# Patient Record
Sex: Male | Born: 1970 | Race: White | Hispanic: No | Marital: Married | Smoking: Never smoker
Health system: Southern US, Community
[De-identification: ages and names within clinical notes are randomized; demographics above are authoritative.]

## PROBLEM LIST (undated history)

## (undated) DIAGNOSIS — K219 Gastro-esophageal reflux disease without esophagitis: Secondary | ICD-10-CM

## (undated) DIAGNOSIS — B349 Viral infection, unspecified: Secondary | ICD-10-CM

## (undated) DIAGNOSIS — E785 Hyperlipidemia, unspecified: Secondary | ICD-10-CM

## (undated) DIAGNOSIS — I10 Essential (primary) hypertension: Secondary | ICD-10-CM

## (undated) HISTORY — DX: Viral infection, unspecified: B34.9

## (undated) HISTORY — DX: Essential (primary) hypertension: I10

## (undated) HISTORY — DX: Gastro-esophageal reflux disease without esophagitis: K21.9

## (undated) HISTORY — DX: Hyperlipidemia, unspecified: E78.5

---

## 2004-09-12 ENCOUNTER — Ambulatory Visit: Payer: Self-pay | Admitting: Gastroenterology

## 2006-07-12 ENCOUNTER — Ambulatory Visit: Payer: Self-pay | Admitting: Family Medicine

## 2006-09-17 ENCOUNTER — Other Ambulatory Visit: Payer: Self-pay

## 2006-09-17 ENCOUNTER — Emergency Department: Payer: Self-pay

## 2010-01-09 ENCOUNTER — Ambulatory Visit: Payer: Self-pay | Admitting: Family Medicine

## 2010-08-16 ENCOUNTER — Ambulatory Visit: Payer: Self-pay | Admitting: Family Medicine

## 2012-08-15 ENCOUNTER — Ambulatory Visit: Payer: Self-pay | Admitting: Family Medicine

## 2014-11-01 ENCOUNTER — Encounter: Payer: Self-pay | Admitting: Family Medicine

## 2014-11-01 ENCOUNTER — Ambulatory Visit (INDEPENDENT_AMBULATORY_CARE_PROVIDER_SITE_OTHER): Payer: BLUE CROSS/BLUE SHIELD | Admitting: Family Medicine

## 2014-11-01 VITALS — BP 110/60 | HR 72 | Ht 67.0 in | Wt 160.0 lb

## 2014-11-01 DIAGNOSIS — J40 Bronchitis, not specified as acute or chronic: Secondary | ICD-10-CM

## 2014-11-01 DIAGNOSIS — R1013 Epigastric pain: Secondary | ICD-10-CM | POA: Diagnosis not present

## 2014-11-01 MED ORDER — PANTOPRAZOLE SODIUM 40 MG PO TBEC: 40.0000 mg | DELAYED_RELEASE_TABLET | Freq: Every day | ORAL | Status: DC

## 2014-11-01 MED ORDER — AMOXICILLIN 500 MG PO CAPS: 500.0000 mg | ORAL_CAPSULE | Freq: Three times a day (TID) | ORAL | Status: DC

## 2014-11-01 NOTE — Progress Notes (Signed)
Name: Matthew Zuniga.   MRN: [id number]    DOB: 11-Apr-1971   Date:11/01/2014       Progress Note  Subjective  Chief Complaint  Chief Complaint  Patient presents with  . Abdominal Pain    upper abdominal pain- "feels like a knot- hurts when you touch it"    Abdominal Pain This is a recurrent problem. The current episode started yesterday. The onset quality is sudden. The problem occurs intermittently. The problem has been unchanged. The pain is located in the RUQ. The pain is moderate. The abdominal pain does not radiate. Pertinent negatives include no constipation, diarrhea, dysuria, fever, headaches, hematochezia, melena, myalgias, nausea or weight loss. The pain is aggravated by coughing and palpation. The pain is relieved by nothing. He has tried proton pump inhibitors for the symptoms. The treatment provided no relief. There is no history of GERD or PUD.  Cough This is a new problem. The current episode started in the past 7 days. The problem has been unchanged. The cough is productive of purulent sputum (primarily in AM). Pertinent negatives include no chest pain, chills, ear congestion, fever, headaches, heartburn, hemoptysis, myalgias, nasal congestion, rash, sore throat, shortness of breath, weight loss or wheezing. Nothing aggravates the symptoms. Treatments tried: antihistamine. There is no history of asthma or bronchitis.    No problem-specific assessment & plan notes found for this encounter.   Past Medical History  Diagnosis Date  . Viral syndrome   . GERD (gastroesophageal reflux disease)   . Hypertension   . Hyperlipidemia     History reviewed. No pertinent past surgical history.  History reviewed. No pertinent family history.  History   Social History  . Marital Status: Married    Spouse Name: N/A  . Number of Children: N/A  . Years of Education: N/A   Occupational History  . Not on file.   Social History Main Topics  . Smoking status: Never Smoker    . Smokeless tobacco: Current User    Types: Snuff  . Alcohol Use: 0.0 oz/week    0 Standard drinks or equivalent per week  . Drug Use: Not on file  . Sexual Activity: Yes   Other Topics Concern  . Not on file   Social History Narrative  . No narrative on file    No Known Allergies   Review of Systems  Constitutional: Negative for fever, chills, weight loss, malaise/fatigue and diaphoresis.  HENT: Negative for congestion, ear discharge, nosebleeds, sore throat and tinnitus.   Eyes: Negative for blurred vision and double vision.  Respiratory: Positive for cough and sputum production. Negative for hemoptysis, shortness of breath and wheezing.   Cardiovascular: Negative for chest pain, palpitations, orthopnea and PND.  Gastrointestinal: Positive for abdominal pain. Negative for heartburn, nausea, diarrhea, constipation, blood in stool, melena and hematochezia.  Genitourinary: Negative for dysuria and urgency.  Musculoskeletal: Negative for myalgias and back pain.  Skin: Negative for rash.  Neurological: Negative for dizziness and headaches.  Endo/Heme/Allergies: Does not bruise/bleed easily.  Psychiatric/Behavioral: Negative for depression.     Objective  Filed Vitals:   11/01/14 1435  BP: 110/60  Pulse: 72  Height: 5\' 7"  (1.702 m)  Weight: 160 lb (72.576 kg)    Physical Exam  Constitutional: He is well-developed, well-nourished, and in no distress. No distress.  HENT:  Head: Normocephalic and atraumatic.  Right Ear: External ear normal.  Left Ear: External ear normal.  Nose: Nose normal.  Mouth/Throat: Oropharynx is clear and moist.  Eyes: Conjunctivae and EOM are normal. Pupils are equal, round, and reactive to light.  Neck: Normal range of motion. Neck supple. No JVD present. No thyromegaly present.  Cardiovascular: Normal rate, regular rhythm, normal heart sounds and intact distal pulses.   Pulmonary/Chest: Effort normal and breath sounds normal. He has no  wheezes. He has no rales.  Abdominal: Soft. Bowel sounds are normal. He exhibits no distension and no mass. There is tenderness. There is no rebound and no guarding.  Lymphadenopathy:    He has no cervical adenopathy.  Neurological: He is alert.  Skin: Skin is warm and dry. He is not diaphoretic.  Psychiatric: Mood and affect normal.      No results found for this or any previous visit (from the past 2160 hour(s)).   Assessment & Plan  Problem List Items Addressed This Visit    None    Visit Diagnoses    Epigastric pain    -  Primary    Relevant Medications    amoxicillin (AMOXIL) 500 MG capsule    pantoprazole (PROTONIX) 40 MG tablet    Other Relevant Orders    CBC w/Diff    Hepatic function panel    Amylase    H. pylori antibody, IgG    US Abdomen Complete    Bronchitis        Relevant Medications    amoxicillin (AMOXIL) 500 MG capsule         Dr. Deanna Jones [facility]  11/01/2014

## 2014-11-02 ENCOUNTER — Ambulatory Visit
Admission: RE | Admit: 2014-11-02 | Discharge: 2014-11-02 | Disposition: A | Discharge status: Home / Self Care | Payer: BLUE CROSS/BLUE SHIELD | Source: Ambulatory Visit | Attending: Family Medicine | Admitting: Family Medicine

## 2014-11-02 DIAGNOSIS — K769 Liver disease, unspecified: Secondary | ICD-10-CM | POA: Insufficient documentation

## 2014-11-02 DIAGNOSIS — K760 Fatty (change of) liver, not elsewhere classified: Secondary | ICD-10-CM | POA: Insufficient documentation

## 2014-11-02 DIAGNOSIS — R1013 Epigastric pain: Secondary | ICD-10-CM

## 2014-11-02 LAB — HEPATIC FUNCTION PANEL
ALK PHOS: 67 IU/L (ref 39–117)
ALT: 33 IU/L (ref 0–44)
AST: 30 IU/L (ref 0–40)
Albumin: 4.5 g/dL (ref 3.5–5.5)
BILIRUBIN, DIRECT: 0.11 mg/dL (ref 0.00–0.40)
Bilirubin Total: 0.4 mg/dL (ref 0.0–1.2)
Total Protein: 6.5 g/dL (ref 6.0–8.5)

## 2014-11-02 LAB — CBC WITH DIFFERENTIAL/PLATELET
Basophils Absolute: 0 10*3/uL (ref 0.0–0.2)
Basos: 0 %
EOS (ABSOLUTE): 0.2 10*3/uL (ref 0.0–0.4)
EOS: 3 %
HEMATOCRIT: 41.1 % (ref 37.5–51.0)
Hemoglobin: 14.3 g/dL (ref 12.6–17.7)
Immature Grans (Abs): 0 10*3/uL (ref 0.0–0.1)
Immature Granulocytes: 0 %
Lymphocytes Absolute: 2.5 10*3/uL (ref 0.7–3.1)
Lymphs: 42 %
MCH: 34 pg — AB (ref 26.6–33.0)
MCHC: 34.8 g/dL (ref 31.5–35.7)
MCV: 98 fL — AB (ref 79–97)
MONOCYTES: 13 %
Monocytes Absolute: 0.8 10*3/uL (ref 0.1–0.9)
NEUTROS ABS: 2.5 10*3/uL (ref 1.4–7.0)
Neutrophils: 42 %
Platelets: 242 10*3/uL (ref 150–379)
RBC: 4.21 x10E6/uL (ref 4.14–5.80)
RDW: 12.7 % (ref 12.3–15.4)
WBC: 5.9 10*3/uL (ref 3.4–10.8)

## 2014-11-02 LAB — H. PYLORI ANTIBODY, IGG: H Pylori IgG: <0.9 U/mL (ref 0.0–0.8)

## 2014-11-02 LAB — AMYLASE: Amylase: 41 U/L (ref 31–124)

## 2014-12-27 ENCOUNTER — Other Ambulatory Visit: Payer: Self-pay

## 2014-12-27 DIAGNOSIS — R1013 Epigastric pain: Secondary | ICD-10-CM

## 2014-12-27 DIAGNOSIS — Z8619 Personal history of other infectious and parasitic diseases: Secondary | ICD-10-CM

## 2014-12-27 DIAGNOSIS — I10 Essential (primary) hypertension: Secondary | ICD-10-CM

## 2014-12-27 DIAGNOSIS — K219 Gastro-esophageal reflux disease without esophagitis: Secondary | ICD-10-CM

## 2014-12-27 MED ORDER — ACYCLOVIR 200 MG PO CAPS: 200.0000 mg | ORAL_CAPSULE | Freq: Every day | ORAL | Status: DC

## 2014-12-27 MED ORDER — LOSARTAN POTASSIUM 100 MG PO TABS
100.0000 mg | ORAL_TABLET | Freq: Every day | ORAL | Status: DC
Start: 2014-12-27 — End: 2015-01-26

## 2014-12-27 MED ORDER — PANTOPRAZOLE SODIUM 40 MG PO TBEC: 40.0000 mg | DELAYED_RELEASE_TABLET | Freq: Every day | ORAL | Status: DC

## 2015-01-18 ENCOUNTER — Ambulatory Visit
Admission: EM | Admit: 2015-01-18 | Discharge: 2015-01-18 | Disposition: A | Discharge status: Home / Self Care | Payer: BLUE CROSS/BLUE SHIELD | Attending: Family Medicine | Admitting: Family Medicine

## 2015-01-18 ENCOUNTER — Encounter: Payer: Self-pay | Admitting: Emergency Medicine

## 2015-01-18 DIAGNOSIS — T1591XA Foreign body on external eye, part unspecified, right eye, initial encounter: Secondary | ICD-10-CM

## 2015-01-18 DIAGNOSIS — S0501XA Injury of conjunctiva and corneal abrasion without foreign body, right eye, initial encounter: Secondary | ICD-10-CM

## 2015-01-18 MED ORDER — GENTAMICIN SULFATE 0.3 % OP SOLN: 1.0000 [drp] | Freq: Four times a day (QID) | OPHTHALMIC | Status: DC

## 2015-01-18 MED ORDER — HYDROCODONE-ACETAMINOPHEN 5-325 MG PO TABS: 1.0000 | ORAL_TABLET | Freq: Three times a day (TID) | ORAL | Status: DC | PRN

## 2015-01-18 NOTE — ED Provider Notes (Signed)
CSN: [id number]     Arrival date & time 01/18/15  1731 History   First MD Initiated Contact with Patient 01/18/15 1827     Chief Complaint  Patient presents with  . Eye Injury   (Consider location/radiation/quality/duration/timing/severity/associated sxs/prior Treatment) Patient is a 44 y.o. male presenting with eye injury. The history is provided by the patient and the spouse. No language interpreter was used.  Eye Injury This is a new problem. The current episode started yesterday. The problem occurs constantly. The problem has been gradually worsening. Associated symptoms include headaches. Pertinent negatives include no chest pain, no abdominal pain and no shortness of breath. Exacerbated by: light. Nothing relieves the symptoms. He has tried a cold compress for the symptoms. The treatment provided no relief.    Past Medical History  Diagnosis Date  . Viral syndrome   . GERD (gastroesophageal reflux disease)   . Hypertension   . Hyperlipidemia    History reviewed. No pertinent past surgical history. History reviewed. No pertinent family history. Social History  Substance Use Topics  . Smoking status: Never Smoker   . Smokeless tobacco: Current User    Types: Snuff  . Alcohol Use: 0.0 oz/week    0 Standard drinks or equivalent per week    Review of Systems  Eyes: Positive for photophobia, pain, redness and itching. Negative for visual disturbance.  Respiratory: Negative for shortness of breath.   Cardiovascular: Negative for chest pain.  Gastrointestinal: Negative for abdominal pain.  Neurological: Positive for headaches.  All other systems reviewed and are negative.   Allergies  Review of patient's allergies indicates no known allergies.  Home Medications   Prior to Admission medications   Medication Sig Start Date End Date Taking? Authorizing Provider  acyclovir (ZOVIRAX) 200 MG capsule Take 1 capsule (200 mg total) by mouth daily. 12/27/14   Juline Patch, MD   amoxicillin (AMOXIL) 500 MG capsule Take 1 capsule (500 mg total) by mouth 3 (three) times daily. 11/01/14   Juline Patch, MD  Esomeprazole Magnesium (NEXIUM 24HR PO) Take 22.9 mg by mouth daily. otc    Historical Provider, MD  gentamicin (GARAMYCIN) 0.3 % ophthalmic solution Place 1 drop into the right eye 4 (four) times daily. 01/18/15   Frederich Cha, MD  HYDROcodone-acetaminophen (NORCO) 5-325 MG per tablet Take 1 tablet by mouth every 8 (eight) hours as needed for moderate pain. 01/18/15   Frederich Cha, MD  losartan (COZAAR) 100 MG tablet Take 1 tablet (100 mg total) by mouth daily. 12/27/14   Juline Patch, MD  Multiple Vitamin (MULTIVITAMIN WITH MINERALS) TABS tablet Take 1 tablet by mouth daily.    Historical Provider, MD  omega-3 acid ethyl esters (LOVAZA) 1 G capsule Take 1 g by mouth 2 (two) times daily.    Historical Provider, MD  pantoprazole (PROTONIX) 40 MG tablet Take 1 tablet (40 mg total) by mouth daily. 12/27/14   Juline Patch, MD   Meds Ordered and Administered this Visit  Medications - No data to display  BP 121/82 mmHg  Pulse 74  Temp(Src) 97.6 F (36.4 C) (Tympanic)  Resp 18  Ht 5\' 7"  (1.702 m)  Wt 150 lb (68.04 kg)  BMI 23.49 kg/m2  SpO2 97% No data found.   Physical Exam  Constitutional: He is oriented to person, place, and time. He appears well-developed and well-nourished.  HENT:  Head: Normocephalic and atraumatic.  Eyes: Pupils are equal, round, and reactive to light. Right eye exhibits discharge. Left  eye exhibits no discharge.  Neck: Normal range of motion.  Neurological: He is alert and oriented to person, place, and time.  Skin: Skin is warm and dry.  Psychiatric: He has a normal mood and affect.  Vitals reviewed.   ED Course  FOREIGN BODY REMOVAL Date/Time: 01/18/2015 6:45 PM Performed by: Frederich Cha Authorized by: Frederich Cha Consent: Verbal consent obtained. Body area: eye Local anesthetic: tetracaine drops Patient sedated: no Patient  restrained: no Patient cooperative: yes Localization method: eyelid eversion, visualized and magnification Eye examined with fluorescein. Corneal abrasion size: small Corneal abrasion location: lateral (About 10:00) No residual rust ring present. Depth: superficial Number of foreign bodies recovered: Very fine hair fiber was removed underneath the eyelid. Post-procedure assessment: foreign body removed Patient tolerance: Patient tolerated the procedure well with no immediate complications Comments: Patient informed if not better by tomorrow seen ophthalmologist and death if not doing a lot better by Thursday he needs to see an ophthalmologist.   (including critical care time)  Labs Review Labs Reviewed - No data to display  Imaging Review No results found.   Visual Acuity Review  Right Eye Distance: 20/40 Left Eye Distance: 20/40 Bilateral Distance: 20/40  Right Eye Near:   Left Eye Near:    Bilateral Near:         MDM   1. Corneal abrasion, right, initial encounter   2. Foreign body, eye, right, initial encounter      Patient informed we'll place him on hydrocodone for pain #12, and gentamicin eyedrops 1 drop in right eye 4 times a day. If not better in the morning seen ophthalmologist if not doing a lot better by Thursday see ophthalmologist of choice.   Frederich Cha, MD 01/18/15 [phone]

## 2015-01-18 NOTE — Discharge Instructions (Signed)
Corneal Abrasion °The cornea is the clear covering at the front and center of the eye. When you look at the colored portion of the eye, you are looking through the cornea. It is a thin tissue made up of layers. The top layer is the most sensitive layer. A corneal abrasion happens if this layer is scratched or an injury causes it to come off.  °HOME CARE °· You may be given drops or a medicated cream. Use the medicine as told by your doctor. °· A pressure patch may be put over the eye. If this is done, follow your doctor's instructions for when to remove the patch. Do not drive or use machines while the eye patch is on. Judging distances is hard to do with a patch on. °· See your doctor for a follow-up exam if you are told to do so. It is very important that you keep this appointment. °GET HELP IF:  °· You have pain, are sensitive to light, and have a scratchy feeling in one eye or both eyes. °· Your pressure patch keeps getting loose. You can blink your eye under the patch. °· You have fluid coming from your eye or the lids stick together in the morning. °· You have the same symptoms in the morning that you did with the first abrasion. This could be days, weeks, or months after the first abrasion healed. °MAKE SURE YOU:  °· Understand these instructions. °· Will watch your condition. °· Will get help right away if you are not doing well or get worse. °Document Released: 10/24/2007 Document Revised: 02/25/2013 Document Reviewed: 01/12/2013 °Exit[employer]® Patient Information ©2015 [employer]. This information is not intended to replace advice given to you by your health care provider. Make sure you discuss any questions you have with your health care provider. ° °Eye, Foreign Body °A foreign body is an object that should not be there. The object could be near, on, or in the eye. °HOME CARE °If your doctor prescribes an eye patch: °· Keep the eye patch on. Do this until you see your doctor again. °· Do not remove the  patch to put in medicine unless your doctor tells you. °· Retape it as it was before: °¨ When replacing the patch. °¨ If the patch comes loose. °· Do not drive or use machinery. °· Only take medicine as told by your doctor. °If your doctor does not prescribe an eye patch: °· Keep the eye closed as much as possible. °· Do not rub the eye. °· Wear dark glasses in bright light. °· Do not wear contact lenses until the eye feels normal, or as told by your doctor. °· Wear protective eye covering, especially when using high speed tools. °· Only take medicine as told by your doctor. °GET HELP RIGHT AWAY IF:  °· Your pain gets worse. °· Your vision changes. °· You have problems with the eye patch. °· The injury gets larger. °· There is fluid (discharge) coming from the eye. °· You get puffiness (swelling) and soreness. °· You have an oral temperature above 102° F (38.9° C), not controlled by medicine. °· Your baby is older than 3 months with a rectal temperature of 102° F (38.9° C) or higher. °· Your baby is 3 months old or younger with a rectal temperature of 100.4° F (38° C) or higher. °MAKE SURE YOU:  °· Understand these instructions. °· Will watch your condition. °· Will get help right away if you are not doing well   or get worse. °Document Released: 10/25/2009 Document Revised: 07/30/2011 Document Reviewed: 10/02/2012 °Exit[employer]® Patient Information ©2015 [employer]. This information is not intended to replace advice given to you by your health care provider. Make sure you discuss any questions you have with your health care provider. ° °

## 2015-01-18 NOTE — ED Notes (Signed)
Right eye injury. Patient stated he got metal in his eye on yesterday while cutting metal for a roof.

## 2015-01-26 ENCOUNTER — Encounter: Payer: Self-pay | Admitting: Family Medicine

## 2015-01-26 ENCOUNTER — Ambulatory Visit (INDEPENDENT_AMBULATORY_CARE_PROVIDER_SITE_OTHER): Payer: BLUE CROSS/BLUE SHIELD | Admitting: Family Medicine

## 2015-01-26 VITALS — BP 120/80 | HR 70 | Ht 67.0 in | Wt 159.0 lb

## 2015-01-26 DIAGNOSIS — I10 Essential (primary) hypertension: Secondary | ICD-10-CM | POA: Diagnosis not present

## 2015-01-26 DIAGNOSIS — Z8619 Personal history of other infectious and parasitic diseases: Secondary | ICD-10-CM

## 2015-01-26 DIAGNOSIS — K219 Gastro-esophageal reflux disease without esophagitis: Secondary | ICD-10-CM | POA: Diagnosis not present

## 2015-01-26 DIAGNOSIS — B009 Herpesviral infection, unspecified: Secondary | ICD-10-CM | POA: Diagnosis not present

## 2015-01-26 MED ORDER — ACYCLOVIR 200 MG PO CAPS: 200.0000 mg | ORAL_CAPSULE | Freq: Every day | ORAL | Status: DC

## 2015-01-26 MED ORDER — LOSARTAN POTASSIUM 100 MG PO TABS: 100.0000 mg | ORAL_TABLET | Freq: Every day | ORAL | Status: DC

## 2015-01-26 MED ORDER — PANTOPRAZOLE SODIUM 40 MG PO TBEC: 40.0000 mg | DELAYED_RELEASE_TABLET | Freq: Every day | ORAL | Status: DC

## 2015-01-26 NOTE — Progress Notes (Signed)
Name: Matthew Zuniga.   MRN: [id number]    DOB: Sep 18, 1970   Date:01/26/2015       Progress Note  Subjective  Chief Complaint  Chief Complaint  Patient presents with  . Hypertension  . Gastrophageal Reflux  . Mouth Lesions    Hypertension This is a chronic problem. The current episode started more than 1 year ago. The problem is unchanged. The problem is controlled. Pertinent negatives include no anxiety, blurred vision, chest pain, headaches, malaise/fatigue, neck pain, orthopnea, palpitations, peripheral edema, PND, shortness of breath or sweats. There are no associated agents to hypertension. Past treatments include angiotensin blockers. The current treatment provides no improvement. There are no compliance problems.  There is no history of angina, kidney disease, CAD/MI, CVA, heart failure, left ventricular hypertrophy, PVD, renovascular disease or retinopathy. There is no history of chronic renal disease.  Gastrophageal Reflux He reports no abdominal pain, no belching, no chest pain, no choking, no coughing, no dysphagia, no early satiety, no globus sensation, no heartburn, no hoarse voice, no nausea, no sore throat, no stridor, no tooth decay, no water brash or no wheezing. This is a chronic problem. The current episode started more than 1 year ago. The problem occurs rarely. The problem has been unchanged. Nothing aggravates the symptoms. Pertinent negatives include no anemia, fatigue, melena, muscle weakness, orthopnea or weight loss. The treatment provided no relief. Past procedures do not include an abdominal ultrasound, an EGD, esophageal manometry, esophageal pH monitoring, H. pylori antibody titer or a UGI.  Mouth Lesions  The problem occurs occasionally. The problem is mild. Associated symptoms include mouth sores. Pertinent negatives include no orthopnea, no fever, no abdominal pain, no constipation, no diarrhea, no nausea, no ear discharge, no ear pain, no headaches, no sore  throat, no neck pain, no cough, no wheezing and no rash.    No problem-specific assessment & plan notes found for this encounter.   Past Medical History  Diagnosis Date  . Viral syndrome   . GERD (gastroesophageal reflux disease)   . Hypertension   . Hyperlipidemia     History reviewed. No pertinent past surgical history.  History reviewed. No pertinent family history.  Social History   Social History  . Marital Status: Married    Spouse Name: N/A  . Number of Children: N/A  . Years of Education: N/A   Occupational History  . Not on file.   Social History Main Topics  . Smoking status: Never Smoker   . Smokeless tobacco: Current User    Types: Snuff  . Alcohol Use: 0.0 oz/week    0 Standard drinks or equivalent per week  . Drug Use: Not on file  . Sexual Activity: Yes   Other Topics Concern  . Not on file   Social History Narrative    No Known Allergies   Review of Systems  Constitutional: Negative for fever, chills, weight loss, malaise/fatigue and fatigue.  HENT: Positive for mouth sores. Negative for ear discharge, ear pain, hoarse voice and sore throat.   Eyes: Negative for blurred vision.  Respiratory: Negative for cough, sputum production, choking, shortness of breath and wheezing.   Cardiovascular: Negative for chest pain, palpitations, orthopnea, leg swelling and PND.  Gastrointestinal: Negative for heartburn, dysphagia, nausea, abdominal pain, diarrhea, constipation, blood in stool and melena.  Genitourinary: Negative for dysuria, urgency, frequency and hematuria.  Musculoskeletal: Negative for myalgias, back pain, joint pain, muscle weakness and neck pain.  Skin: Negative for rash.  Neurological: Negative  for dizziness, tingling, sensory change, focal weakness and headaches.  Endo/Heme/Allergies: Negative for environmental allergies and polydipsia. Does not bruise/bleed easily.  Psychiatric/Behavioral: Negative for depression and suicidal ideas.  The patient is not nervous/anxious and does not have insomnia.      Objective  Filed Vitals:   01/26/15 0812  BP: 120/80  Pulse: 70  Height: 5\' 7"  (1.702 m)  Weight: 159 lb (72.122 kg)    Physical Exam  Constitutional: He is oriented to person, place, and time and well-developed, well-nourished, and in no distress.  HENT:  Head: Normocephalic.  Right Ear: External ear normal.  Left Ear: External ear normal.  Nose: Nose normal.  Mouth/Throat: Oropharynx is clear and moist.  Eyes: Conjunctivae and EOM are normal. Pupils are equal, round, and reactive to light. Right eye exhibits no discharge. Left eye exhibits no discharge. No scleral icterus.  Neck: Normal range of motion. Neck supple. No JVD present. No tracheal deviation present. No thyromegaly present.  Cardiovascular: Normal rate, regular rhythm, normal heart sounds and intact distal pulses.  Exam reveals no gallop and no friction rub.   No murmur heard. Pulmonary/Chest: Breath sounds normal. No respiratory distress. He has no wheezes. He has no rales.  Abdominal: Soft. Bowel sounds are normal. He exhibits no mass. There is no hepatosplenomegaly. There is no tenderness. There is no rebound, no guarding and no CVA tenderness.  Musculoskeletal: Normal range of motion. He exhibits no edema or tenderness.  Lymphadenopathy:    He has no cervical adenopathy.  Neurological: He is alert and oriented to person, place, and time. He has normal sensation, normal strength, normal reflexes and intact cranial nerves. No cranial nerve deficit.  Skin: Skin is warm. No rash noted.  Psychiatric: Mood and affect normal.      Assessment & Plan  Problem List Items Addressed This Visit    None    Visit Diagnoses    Essential hypertension    -  Primary    Relevant Medications    losartan (COZAAR) 100 MG tablet    Other Relevant Orders    Renal Function Panel    Gastroesophageal reflux disease, esophagitis presence not specified         Relevant Medications    pantoprazole (PROTONIX) 40 MG tablet    HSV-1 (herpes simplex virus 1) infection        Relevant Medications    acyclovir (ZOVIRAX) 200 MG capsule    Gastroesophageal reflux disease without esophagitis        Relevant Medications    pantoprazole (PROTONIX) 40 MG tablet    H/O cold sores        Relevant Medications    acyclovir (ZOVIRAX) 200 MG capsule         Dr. Rontae Inglett [facility]  01/26/2015

## 2015-01-27 LAB — RENAL FUNCTION PANEL
Albumin: 4.7 g/dL (ref 3.5–5.5)
BUN / CREAT RATIO: 16 (ref 9–20)
BUN: 11 mg/dL (ref 6–24)
CALCIUM: 10.1 mg/dL (ref 8.7–10.2)
CHLORIDE: 95 mmol/L — AB (ref 97–108)
CO2: 28 mmol/L (ref 18–29)
Creatinine, Ser: 0.67 mg/dL — ABNORMAL LOW (ref 0.76–1.27)
GFR calc non Af Amer: 119 mL/min/{1.73_m2} (ref >59)
GFR, EST AFRICAN AMERICAN: 138 mL/min/{1.73_m2} (ref >59)
GLUCOSE: 66 mg/dL (ref 65–99)
POTASSIUM: 4.2 mmol/L (ref 3.5–5.2)
Phosphorus: 3.3 mg/dL (ref 2.5–4.5)
Sodium: 139 mmol/L (ref 134–144)

## 2015-02-25 ENCOUNTER — Other Ambulatory Visit: Payer: Self-pay

## 2015-02-25 DIAGNOSIS — J329 Chronic sinusitis, unspecified: Secondary | ICD-10-CM

## 2015-02-25 MED ORDER — AZITHROMYCIN 250 MG PO TABS: ORAL_TABLET | ORAL | Status: DC

## 2015-02-27 ENCOUNTER — Other Ambulatory Visit: Payer: Self-pay | Admitting: Family Medicine

## 2015-03-18 ENCOUNTER — Ambulatory Visit (INDEPENDENT_AMBULATORY_CARE_PROVIDER_SITE_OTHER): Payer: BLUE CROSS/BLUE SHIELD | Admitting: Family Medicine

## 2015-03-18 ENCOUNTER — Encounter: Payer: Self-pay | Admitting: Family Medicine

## 2015-03-18 VITALS — BP 120/80 | HR 78 | Ht 67.0 in | Wt 166.0 lb

## 2015-03-18 DIAGNOSIS — J0100 Acute maxillary sinusitis, unspecified: Secondary | ICD-10-CM | POA: Diagnosis not present

## 2015-03-18 MED ORDER — GUAIFENESIN-CODEINE 100-10 MG/5ML PO SOLN: 5.0000 mL | Freq: Three times a day (TID) | ORAL | Status: DC | PRN

## 2015-03-18 MED ORDER — LEVOFLOXACIN 500 MG PO TABS: 500.0000 mg | ORAL_TABLET | Freq: Every day | ORAL | Status: DC

## 2015-03-18 NOTE — Progress Notes (Signed)
Name: Matthew Zuniga.   MRN: [id number]    DOB: 09-04-1970   Date:03/18/2015       Progress Note  Subjective  Chief Complaint  Chief Complaint  Patient presents with  . Sinusitis    finished ZPACK x 1 and half weeks ago, had some Amox left over- somewhat better but not completely    Sinusitis This is a new problem. The current episode started in the past 7 days. The problem is unchanged. The maximum temperature recorded prior to his arrival was 100.4 - 100.9 F. The pain is mild. Associated symptoms include chills, congestion and coughing. Pertinent negatives include no diaphoresis, ear pain, headaches, hoarse voice, neck pain, shortness of breath, sinus pressure, sneezing, sore throat or swollen glands. The treatment provided mild relief.    No problem-specific assessment & plan notes found for this encounter.   Past Medical History  Diagnosis Date  . Viral syndrome   . GERD (gastroesophageal reflux disease)   . Hypertension   . Hyperlipidemia     History reviewed. No pertinent past surgical history.  History reviewed. No pertinent family history.  Social History   Social History  . Marital Status: Married    Spouse Name: N/A  . Number of Children: N/A  . Years of Education: N/A   Occupational History  . Not on file.   Social History Main Topics  . Smoking status: Never Smoker   . Smokeless tobacco: Current User    Types: Snuff  . Alcohol Use: 0.0 oz/week    0 Standard drinks or equivalent per week  . Drug Use: Not on file  . Sexual Activity: Yes   Other Topics Concern  . Not on file   Social History Narrative    No Known Allergies   Review of Systems  Constitutional: Positive for chills. Negative for fever, weight loss, malaise/fatigue and diaphoresis.  HENT: Positive for congestion. Negative for ear discharge, ear pain, hoarse voice, sinus pressure, sneezing and sore throat.   Eyes: Negative for blurred vision.  Respiratory: Positive for cough.  Negative for sputum production, shortness of breath and wheezing.   Cardiovascular: Negative for chest pain, palpitations and leg swelling.  Gastrointestinal: Negative for heartburn, nausea, abdominal pain, diarrhea, constipation, blood in stool and melena.  Genitourinary: Negative for dysuria, urgency, frequency and hematuria.  Musculoskeletal: Negative for myalgias, back pain, joint pain and neck pain.  Skin: Negative for rash.  Neurological: Negative for dizziness, tingling, sensory change, focal weakness and headaches.  Endo/Heme/Allergies: Negative for environmental allergies and polydipsia. Does not bruise/bleed easily.  Psychiatric/Behavioral: Negative for depression and suicidal ideas. The patient is not nervous/anxious and does not have insomnia.      Objective  Filed Vitals:   03/18/15 0858  BP: 120/80  Pulse: 78  Height: 5\' 7"  (1.702 m)  Weight: 166 lb (75.297 kg)    Physical Exam  Constitutional: He is oriented to person, place, and time and well-developed, well-nourished, and in no distress.  HENT:  Head: Normocephalic.  Right Ear: External ear normal.  Left Ear: External ear normal.  Nose: Nose normal.  Mouth/Throat: Oropharynx is clear and moist.  Eyes: Conjunctivae and EOM are normal. Pupils are equal, round, and reactive to light. Right eye exhibits no discharge. Left eye exhibits no discharge. No scleral icterus.  Neck: Normal range of motion. Neck supple. No JVD present. No tracheal deviation present. No thyromegaly present.  Cardiovascular: Normal rate, regular rhythm, normal heart sounds and intact distal pulses.  Exam  reveals no gallop and no friction rub.   No murmur heard. Pulmonary/Chest: Breath sounds normal. No respiratory distress. He has no wheezes. He has no rales.  Abdominal: Soft. Bowel sounds are normal. He exhibits no mass. There is no hepatosplenomegaly. There is no tenderness. There is no rebound, no guarding and no CVA tenderness.   Musculoskeletal: Normal range of motion. He exhibits no edema or tenderness.  Lymphadenopathy:    He has no cervical adenopathy.  Neurological: He is alert and oriented to person, place, and time. He has normal sensation, normal strength, normal reflexes and intact cranial nerves. No cranial nerve deficit.  Skin: Skin is warm. No rash noted.  Psychiatric: Mood and affect normal.  Nursing note and vitals reviewed.     Assessment & Plan  Problem List Items Addressed This Visit    None    Visit Diagnoses    Acute maxillary sinusitis, recurrence not specified    -  Primary    Relevant Medications    levofloxacin (LEVAQUIN) 500 MG tablet    guaiFENesin-codeine 100-10 MG/5ML syrup         Dr. Mac[facility]  03/18/2015

## 2015-11-21 ENCOUNTER — Other Ambulatory Visit: Payer: Self-pay | Admitting: Family Medicine

## 2015-11-29 ENCOUNTER — Ambulatory Visit: Payer: BLUE CROSS/BLUE SHIELD | Admitting: Family Medicine

## 2015-12-02 ENCOUNTER — Ambulatory Visit (INDEPENDENT_AMBULATORY_CARE_PROVIDER_SITE_OTHER): Payer: BLUE CROSS/BLUE SHIELD | Admitting: Family Medicine

## 2015-12-02 ENCOUNTER — Encounter: Payer: Self-pay | Admitting: Family Medicine

## 2015-12-02 VITALS — BP 124/80 | HR 84 | Ht 67.0 in | Wt 165.0 lb

## 2015-12-02 DIAGNOSIS — I10 Essential (primary) hypertension: Secondary | ICD-10-CM | POA: Diagnosis not present

## 2015-12-02 DIAGNOSIS — Z8619 Personal history of other infectious and parasitic diseases: Secondary | ICD-10-CM

## 2015-12-02 DIAGNOSIS — J0100 Acute maxillary sinusitis, unspecified: Secondary | ICD-10-CM

## 2015-12-02 DIAGNOSIS — B349 Viral infection, unspecified: Secondary | ICD-10-CM

## 2015-12-02 DIAGNOSIS — K219 Gastro-esophageal reflux disease without esophagitis: Secondary | ICD-10-CM | POA: Diagnosis not present

## 2015-12-02 MED ORDER — LOSARTAN POTASSIUM 100 MG PO TABS: 100.0000 mg | ORAL_TABLET | Freq: Every day | ORAL | Status: DC

## 2015-12-02 MED ORDER — ACYCLOVIR 200 MG PO CAPS: 200.0000 mg | ORAL_CAPSULE | Freq: Every day | ORAL | Status: DC

## 2015-12-02 MED ORDER — LEVOFLOXACIN 500 MG PO TABS: 500.0000 mg | ORAL_TABLET | Freq: Every day | ORAL | Status: DC

## 2015-12-02 MED ORDER — ESOMEPRAZOLE MAGNESIUM 40 MG PO CPDR: 40.0000 mg | DELAYED_RELEASE_CAPSULE | Freq: Every day | ORAL | Status: DC

## 2015-12-02 NOTE — Progress Notes (Signed)
Name: Matthew Zuniga.   MRN: [id number]    DOB: 1970/08/04   Date:12/02/2015       Progress Note  Subjective  Chief Complaint  Chief Complaint  Patient presents with  . Hypertension  . fever blisters  . Gastroesophageal Reflux    takes Nexium otc  . Sinusitis    wants a ZPack    Hypertension This is a chronic problem. The current episode started more than 1 year ago. The problem has been gradually improving since onset. The problem is controlled. Pertinent negatives include no anxiety, blurred vision, chest pain, headaches, malaise/fatigue, neck pain, orthopnea, palpitations, peripheral edema, PND, shortness of breath or sweats. There are no associated agents to hypertension. There are no known risk factors for coronary artery disease. Past treatments include angiotensin blockers. The current treatment provides moderate improvement. There are no compliance problems.  There is no history of angina, kidney disease, CAD/MI, CVA, heart failure, left ventricular hypertrophy, PVD, renovascular disease or retinopathy. There is no history of chronic renal disease or a hypertension causing med.  Gastroesophageal Reflux He reports no abdominal pain, no belching, no chest pain, no choking, no coughing, no dysphagia, no early satiety, no globus sensation, no heartburn, no hoarse voice, no nausea, no sore throat, no stridor, no tooth decay, no water brash or no wheezing. This is a chronic problem. The current episode started more than 1 year ago. The problem has been gradually improving. Pertinent negatives include no melena or weight loss. He has tried an antacid and a PPI for the symptoms. The treatment provided moderate relief.  Sinusitis This is a chronic problem. The current episode started more than 1 year ago. The problem has been gradually improving since onset. There has been no fever. Pertinent negatives include no chills, coughing, ear pain, headaches, hoarse voice, neck pain, shortness of  breath or sore throat. Past treatments include antibiotics. The treatment provided mild relief.    No problem-specific assessment & plan notes found for this encounter.   Past Medical History  Diagnosis Date  . Viral syndrome   . GERD (gastroesophageal reflux disease)   . Hypertension   . Hyperlipidemia     History reviewed. No pertinent past surgical history.  History reviewed. No pertinent family history.  Social History   Social History  . Marital Status: Married    Spouse Name: N/A  . Number of Children: N/A  . Years of Education: N/A   Occupational History  . Not on file.   Social History Main Topics  . Smoking status: Never Smoker   . Smokeless tobacco: Current User    Types: Snuff  . Alcohol Use: 0.0 oz/week    0 Standard drinks or equivalent per week  . Drug Use: Not on file  . Sexual Activity: Yes   Other Topics Concern  . Not on file   Social History Narrative    No Known Allergies   Review of Systems  Constitutional: Negative for fever, chills, weight loss and malaise/fatigue.  HENT: Negative for ear discharge, ear pain, hoarse voice and sore throat.   Eyes: Negative for blurred vision.  Respiratory: Negative for cough, sputum production, choking, shortness of breath and wheezing.   Cardiovascular: Negative for chest pain, palpitations, orthopnea, leg swelling and PND.  Gastrointestinal: Negative for heartburn, dysphagia, nausea, abdominal pain, diarrhea, constipation, blood in stool and melena.  Genitourinary: Negative for dysuria, urgency, frequency and hematuria.  Musculoskeletal: Negative for myalgias, back pain, joint pain and neck pain.  Skin: Negative for rash.  Neurological: Negative for dizziness, tingling, sensory change, focal weakness and headaches.  Endo/Heme/Allergies: Negative for environmental allergies and polydipsia. Does not bruise/bleed easily.  Psychiatric/Behavioral: Negative for depression and suicidal ideas. The patient is  not nervous/anxious and does not have insomnia.      Objective  Filed Vitals:   12/02/15 0821  BP: 124/80  Pulse: 84  Height: 5\' 7"  (1.702 m)  Weight: 165 lb (74.844 kg)    Physical Exam  Constitutional: He is oriented to person, place, and time and well-developed, well-nourished, and in no distress.  HENT:  Head: Normocephalic.  Right Ear: Tympanic membrane and external ear normal.  Left Ear: Tympanic membrane and external ear normal.  Nose: Nose normal.  Mouth/Throat: Oropharynx is clear and moist.  Eyes: Conjunctivae and EOM are normal. Pupils are equal, round, and reactive to light. Right eye exhibits no discharge. Left eye exhibits no discharge. No scleral icterus.  Neck: Normal range of motion. Neck supple. No JVD present. No tracheal deviation present. No thyromegaly present.  Cardiovascular: Normal rate, regular rhythm, normal heart sounds and intact distal pulses.  Exam reveals no gallop and no friction rub.   No murmur heard. Pulmonary/Chest: Breath sounds normal. No respiratory distress. He has no wheezes. He has no rales.  Abdominal: Soft. Bowel sounds are normal. He exhibits no mass. There is no hepatosplenomegaly. There is no tenderness. There is no rebound, no guarding and no CVA tenderness.  Musculoskeletal: Normal range of motion. He exhibits no edema or tenderness.  Lymphadenopathy:    He has no cervical adenopathy.  Neurological: He is alert and oriented to person, place, and time. He has normal sensation, normal strength and intact cranial nerves. No cranial nerve deficit.  Skin: Skin is warm. No rash noted.  Psychiatric: Mood and affect normal.  Nursing note and vitals reviewed.     Assessment & Plan  Problem List Items Addressed This Visit      Cardiovascular and Mediastinum   Essential hypertension - Primary   Relevant Medications   losartan (COZAAR) 100 MG tablet   Other Relevant Orders   Renal Function Panel     Digestive   Esophageal reflux    Relevant Medications   esomeprazole (NEXIUM) 40 MG capsule     Other   Viral syndrome   Relevant Medications   acyclovir (ZOVIRAX) 200 MG capsule    Other Visit Diagnoses    Acute maxillary sinusitis, recurrence not specified        Relevant Medications    acyclovir (ZOVIRAX) 200 MG capsule    levofloxacin (LEVAQUIN) 500 MG tablet    H/O cold sores        Relevant Medications    acyclovir (ZOVIRAX) 200 MG capsule         Dr. Taytum Scheck [facility]  12/02/2015

## 2015-12-03 LAB — RENAL FUNCTION PANEL
ALBUMIN: 4.6 g/dL (ref 3.5–5.5)
BUN/Creatinine Ratio: 17 (ref 9–20)
BUN: 13 mg/dL (ref 6–24)
CALCIUM: 9.5 mg/dL (ref 8.7–10.2)
CO2: 24 mmol/L (ref 18–29)
Chloride: 96 mmol/L (ref 96–106)
Creatinine, Ser: 0.76 mg/dL (ref 0.76–1.27)
GFR calc Af Amer: 130 mL/min/{1.73_m2} (ref >59)
GFR calc non Af Amer: 113 mL/min/{1.73_m2} (ref >59)
GLUCOSE: 59 mg/dL — AB (ref 65–99)
PHOSPHORUS: 3 mg/dL (ref 2.5–4.5)
POTASSIUM: 4.2 mmol/L (ref 3.5–5.2)
SODIUM: 139 mmol/L (ref 134–144)

## 2016-04-22 DIAGNOSIS — J189 Pneumonia, unspecified organism: Secondary | ICD-10-CM | POA: Insufficient documentation

## 2016-04-22 DIAGNOSIS — F10929 Alcohol use, unspecified with intoxication, unspecified: Secondary | ICD-10-CM | POA: Insufficient documentation

## 2016-07-02 IMAGING — US US ABDOMEN COMPLETE
1 series · 14 of 25 positions shown · non-contrast
Comparison: None.

CLINICAL DATA: Epigastric pain.

EXAM:
ULTRASOUND ABDOMEN COMPLETE

[Series 1: us abdomen complete · 0.23mm/px · 14 of 89 slices shown]
[im 1/89]
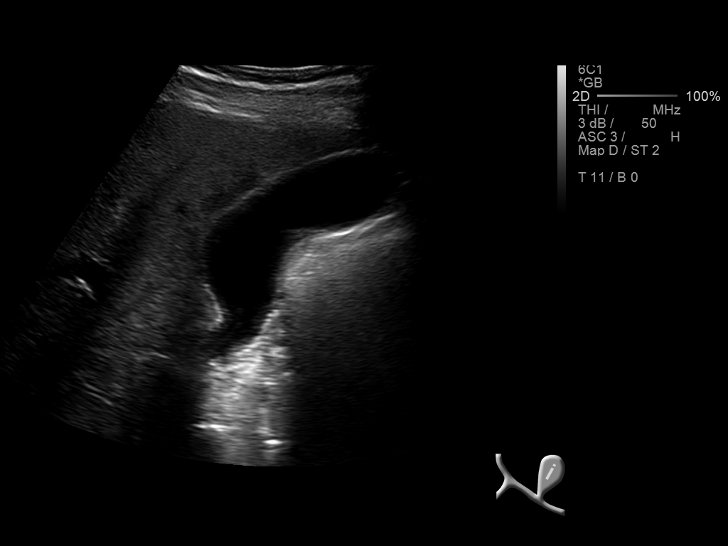
[im 8/89]
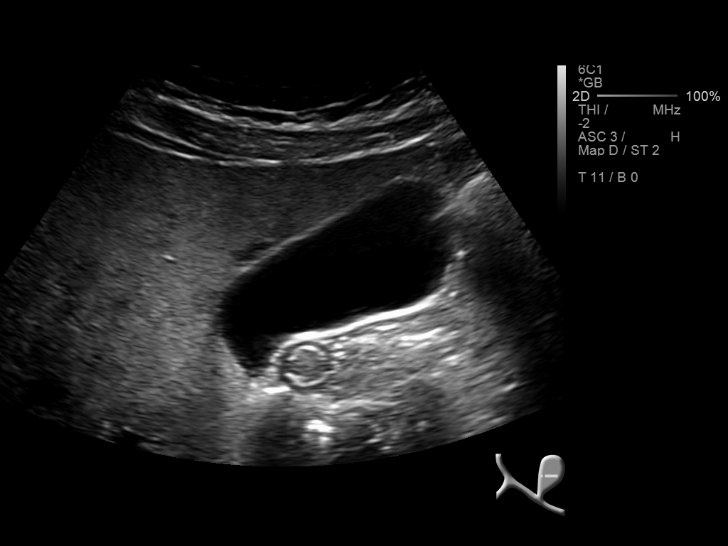
[im 15/89]
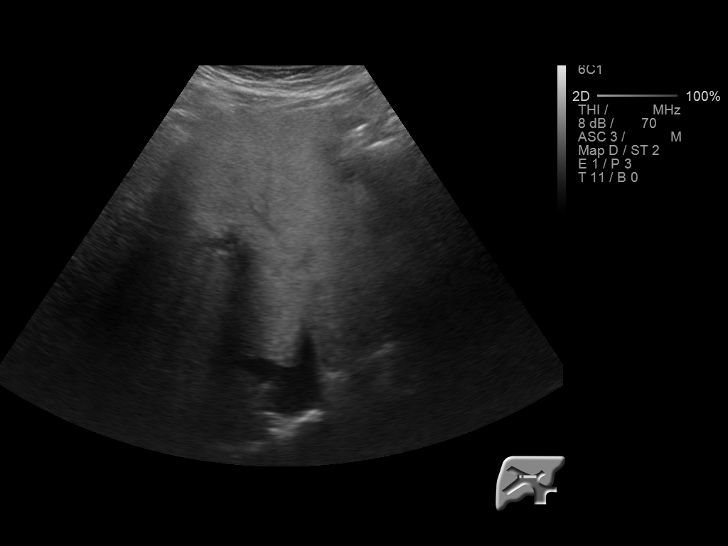
[im 23/89]
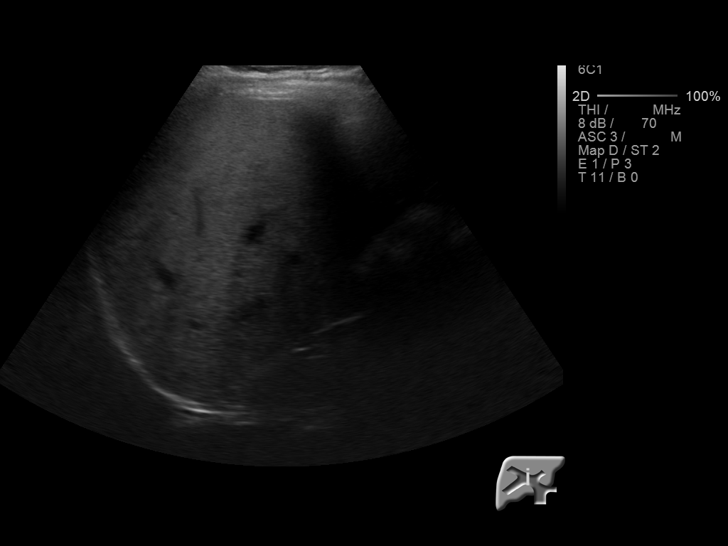
[im 30/89]
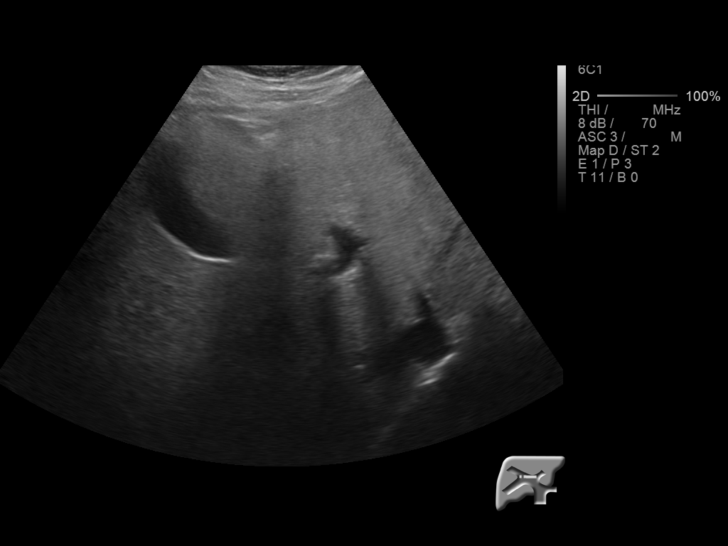
[im 34/89]
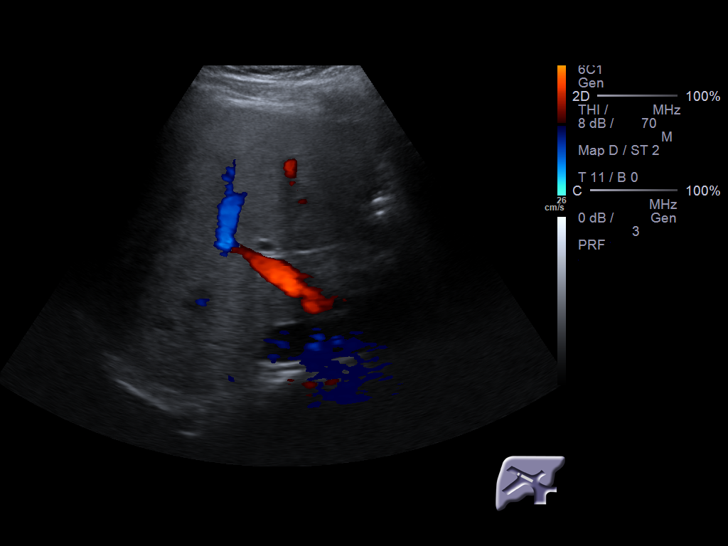
[im 41/89]
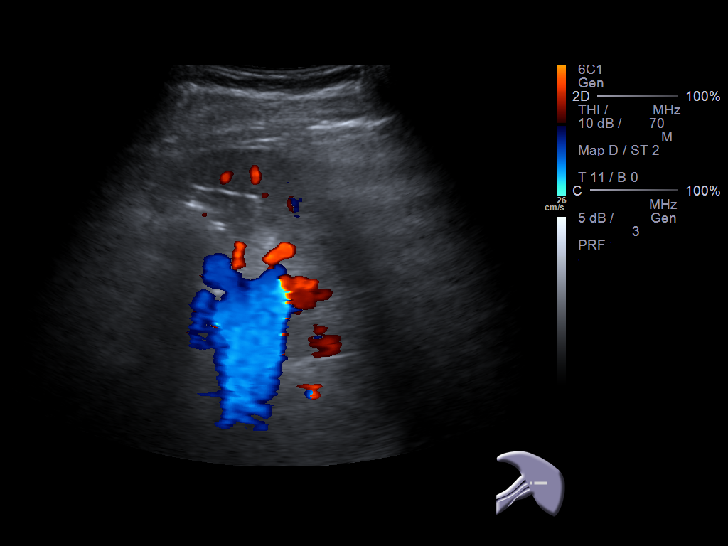
[im 48/89]
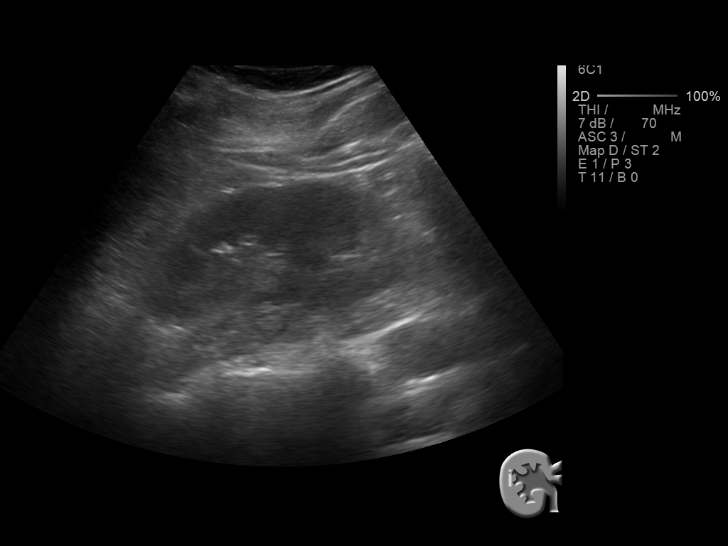
[im 56/89]
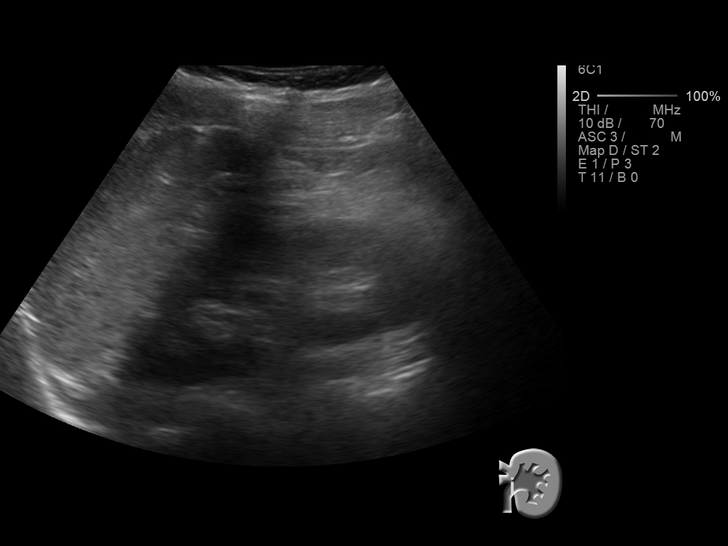
[im 59/89]
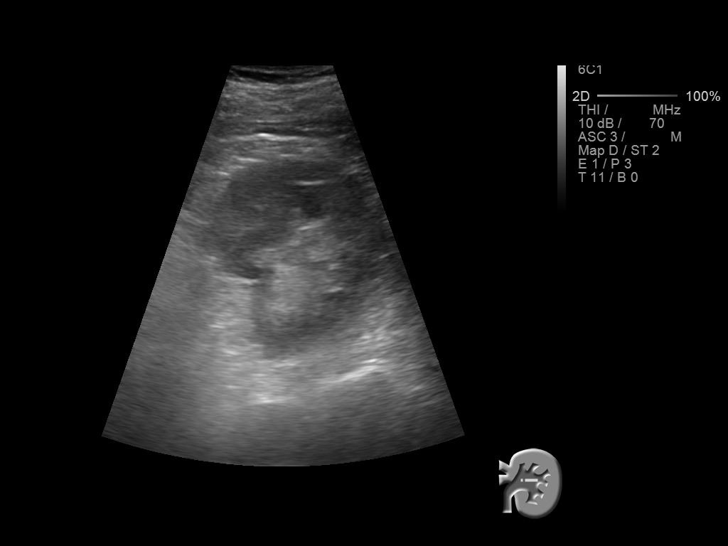
[im 67/89]
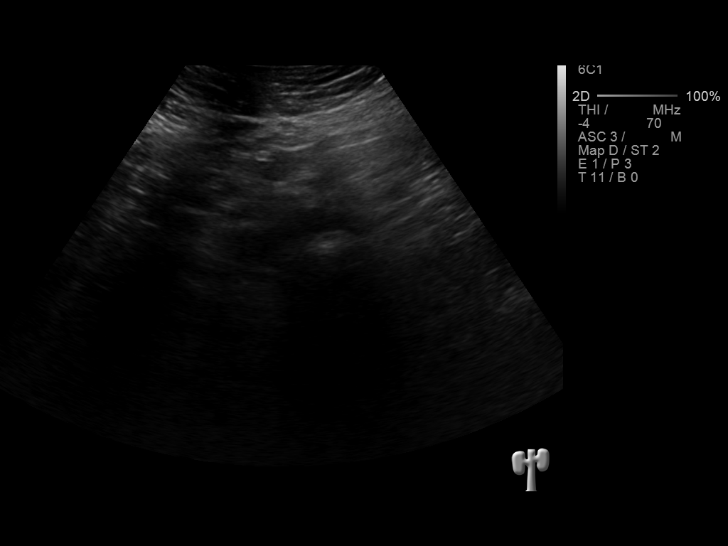
[im 74/89]
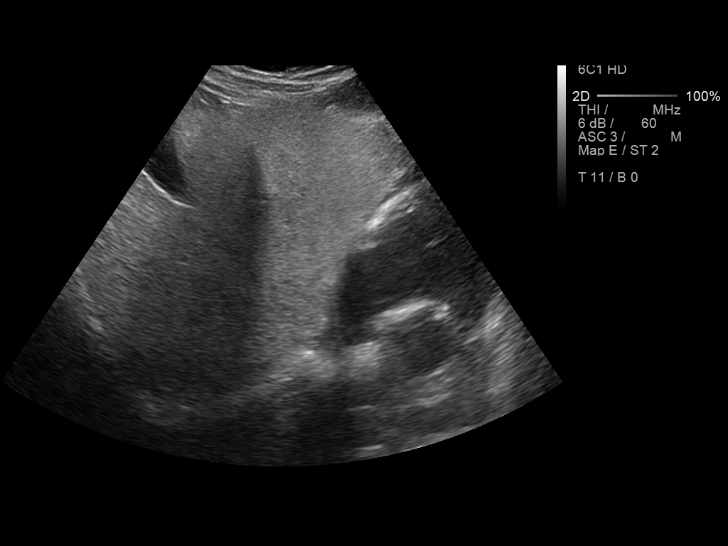
[im 81/89]
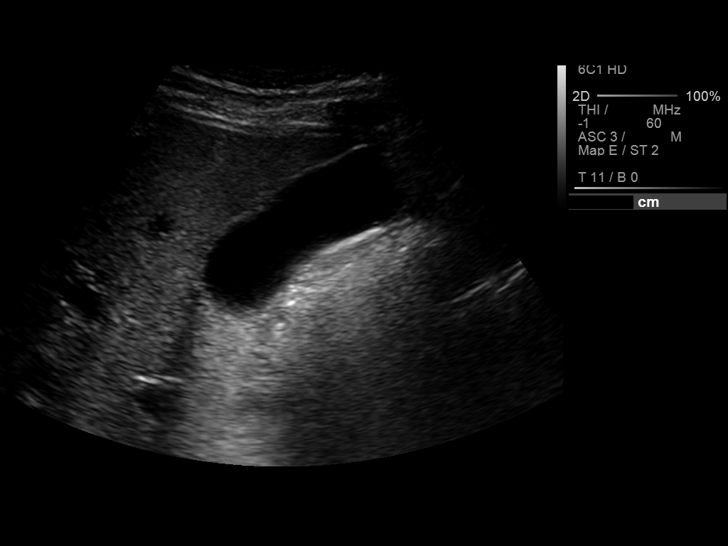
[im 89/89]
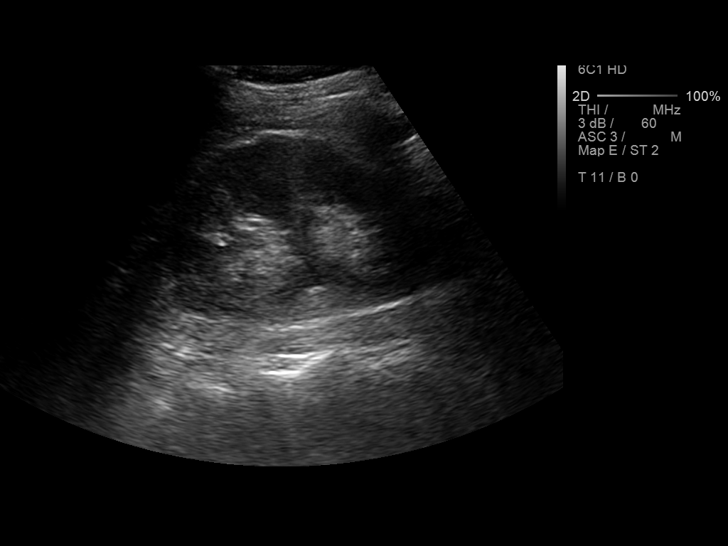

[14 of 25 positions shown; findings below may reference images not displayed]

FINDINGS: Gallbladder: No gallstones or wall thickening visualized. No
sonographic Murphy sign noted.

Common bile duct: Diameter: 6 mm

Liver: Liver is echogenic consistent fatty infiltration and/or
hepatocellular disease.

IVC: No abnormality visualized.

Pancreas: Visualized portion unremarkable.

Spleen: Size and appearance within normal limits.

Right Kidney: Length: 11.6 cm. Echogenicity within normal limits. No
mass or hydronephrosis visualized.

Left Kidney: Length: 10.7 cm. Echogenicity within normal limits. No
mass or hydronephrosis visualized.

Abdominal aorta: No aneurysm visualized.

Other findings: None.
IMPRESSION: Liver is echogenic consistent with fatty infiltration and/or
hepatocellular disease. Exam otherwise unremarkable.

## 2016-09-11 ENCOUNTER — Encounter: Payer: Self-pay | Admitting: Family Medicine

## 2016-09-11 ENCOUNTER — Ambulatory Visit (INDEPENDENT_AMBULATORY_CARE_PROVIDER_SITE_OTHER): Payer: BLUE CROSS/BLUE SHIELD | Admitting: Family Medicine

## 2016-09-11 VITALS — BP 130/82 | HR 76 | Ht 67.0 in | Wt 167.0 lb

## 2016-09-11 DIAGNOSIS — B349 Viral infection, unspecified: Secondary | ICD-10-CM | POA: Diagnosis not present

## 2016-09-11 DIAGNOSIS — E785 Hyperlipidemia, unspecified: Secondary | ICD-10-CM

## 2016-09-11 DIAGNOSIS — Z8619 Personal history of other infectious and parasitic diseases: Secondary | ICD-10-CM

## 2016-09-11 DIAGNOSIS — I10 Essential (primary) hypertension: Secondary | ICD-10-CM

## 2016-09-11 DIAGNOSIS — K219 Gastro-esophageal reflux disease without esophagitis: Secondary | ICD-10-CM | POA: Diagnosis not present

## 2016-09-11 DIAGNOSIS — Z23 Encounter for immunization: Secondary | ICD-10-CM | POA: Diagnosis not present

## 2016-09-11 MED ORDER — LOSARTAN POTASSIUM 100 MG PO TABS: 100.0000 mg | ORAL_TABLET | Freq: Every day | ORAL | 2 refills | Status: DC

## 2016-09-11 MED ORDER — OMEGA-3 1000 MG PO CAPS: 1.0000 | ORAL_CAPSULE | Freq: Every day | ORAL | 3 refills | Status: DC

## 2016-09-11 MED ORDER — ACYCLOVIR 200 MG PO CAPS: 200.0000 mg | ORAL_CAPSULE | Freq: Every day | ORAL | 3 refills | Status: DC

## 2016-09-11 MED ORDER — ESOMEPRAZOLE MAGNESIUM 40 MG PO CPDR: 40.0000 mg | DELAYED_RELEASE_CAPSULE | Freq: Every day | ORAL | 2 refills | Status: DC

## 2016-09-11 NOTE — Progress Notes (Signed)
Name: Matthew Zuniga.   MRN: [id number]    DOB: 1971/05/06   Date:09/11/2016       Progress Note  Subjective  Chief Complaint  Chief Complaint  Patient presents with  . Gastroesophageal Reflux  . Hypertension  . Mouth Lesions    takes Acyclovir for this    Gastroesophageal Reflux  He reports no abdominal pain, no belching, no chest pain, no choking, no coughing, no dysphagia, no early satiety, no globus sensation, no heartburn, no hoarse voice, no nausea, no sore throat, no stridor, no tooth decay, no water brash or no wheezing. This is a chronic problem. The current episode started more than 1 year ago. The problem has been unchanged. Nothing aggravates the symptoms. Pertinent negatives include no anemia, fatigue, melena, muscle weakness, orthopnea or weight loss. There are no known risk factors. He has tried a PPI for the symptoms. The treatment provided mild relief.  Hypertension  This is a chronic problem. The problem has been gradually improving since onset. The problem is controlled. Pertinent negatives include no anxiety, blurred vision, chest pain, headaches, malaise/fatigue, neck pain, orthopnea, palpitations, peripheral edema, PND, shortness of breath or sweats. There are no known risk factors for coronary artery disease. Past treatments include angiotensin blockers. The current treatment provides moderate improvement. There are no compliance problems.  There is no history of angina, kidney disease, CAD/MI, CVA, heart failure, left ventricular hypertrophy, PVD or retinopathy. There is no history of chronic renal disease, a hypertension causing med or renovascular disease.  Mouth Lesions   The current episode started yesterday. The onset was sudden. The problem occurs occasionally. The problem has been unchanged. The problem is mild. Associated symptoms include mouth sores. Pertinent negatives include no orthopnea, no fever, no decreased vision, no double vision, no eye itching, no  photophobia, no abdominal pain, no constipation, no diarrhea, no nausea, no congestion, no ear discharge, no ear pain, no headaches, no sore throat, no swollen glands, no neck pain, no cough, no wheezing, no rash, no eye discharge, no eye pain and no eye redness.  Hyperlipidemia  This is a chronic problem. The problem is controlled. Recent lipid tests were reviewed and are normal. He has no history of chronic renal disease. Pertinent negatives include no chest pain, focal weakness, myalgias or shortness of breath. Treatments tried: omega 3. The current treatment provides mild improvement of lipids. There are no compliance problems.     No problem-specific Assessment & Plan notes found for this encounter.   Past Medical History:  Diagnosis Date  . GERD (gastroesophageal reflux disease)   . Hyperlipidemia   . Hypertension   . Viral syndrome     No past surgical history on file.  No family history on file.  Social History   Social History  . Marital status: Married    Spouse name: N/A  . Number of children: N/A  . Years of education: N/A   Occupational History  . Not on file.   Social History Main Topics  . Smoking status: Never Smoker  . Smokeless tobacco: Current User    Types: Snuff  . Alcohol use 0.0 oz/week  . Drug use: Unknown  . Sexual activity: Yes   Other Topics Concern  . Not on file   Social History Narrative  . No narrative on file    No Known Allergies  Outpatient Medications Prior to Visit  Medication Sig Dispense Refill  . Multiple Vitamin (MULTIVITAMIN WITH MINERALS) TABS tablet Take 1 tablet  by mouth daily.    Marland Kitchen acyclovir (ZOVIRAX) 200 MG capsule Take 1 capsule (200 mg total) by mouth daily. 90 capsule 2  . esomeprazole (NEXIUM) 40 MG capsule Take 1 capsule (40 mg total) by mouth daily at 12 noon. otc 90 capsule 2  . losartan (COZAAR) 100 MG tablet Take 1 tablet (100 mg total) by mouth daily. 90 tablet 2  . levofloxacin (LEVAQUIN) 500 MG tablet Take  1 tablet (500 mg total) by mouth daily. 7 tablet 0  . omega-3 acid ethyl esters (LOVAZA) 1 G capsule Take 1 g by mouth 2 (two) times daily.     No facility-administered medications prior to visit.     Review of Systems  Constitutional: Negative for chills, fatigue, fever, malaise/fatigue and weight loss.  HENT: Positive for mouth sores. Negative for congestion, ear discharge, ear pain, hoarse voice and sore throat.   Eyes: Negative for blurred vision, double vision, photophobia, pain, discharge, redness and itching.  Respiratory: Negative for cough, sputum production, choking, shortness of breath and wheezing.   Cardiovascular: Negative for chest pain, palpitations, orthopnea, leg swelling and PND.  Gastrointestinal: Negative for abdominal pain, blood in stool, constipation, diarrhea, dysphagia, heartburn, melena and nausea.  Genitourinary: Negative for dysuria, frequency, hematuria and urgency.  Musculoskeletal: Negative for back pain, joint pain, myalgias, muscle weakness and neck pain.  Skin: Negative for rash.  Neurological: Negative for dizziness, tingling, sensory change, focal weakness and headaches.  Endo/Heme/Allergies: Negative for environmental allergies and polydipsia. Does not bruise/bleed easily.  Psychiatric/Behavioral: Negative for depression and suicidal ideas. The patient is not nervous/anxious and does not have insomnia.      Objective  Vitals:   09/11/16 1020  BP: 130/82  Pulse: 76  Weight: 167 lb (75.8 kg)  Height: 5\' 7"  (1.702 m)    Physical Exam  Constitutional: He is oriented to person, place, and time and well-developed, well-nourished, and in no distress.  HENT:  Head: Normocephalic.  Right Ear: External ear normal.  Left Ear: External ear normal.  Nose: Nose normal.  Mouth/Throat: Oropharynx is clear and moist.  Eyes: Conjunctivae and EOM are normal. Pupils are equal, round, and reactive to light. Right eye exhibits no discharge. Left eye exhibits no  discharge. No scleral icterus.  Neck: Normal range of motion. Neck supple. No JVD present. No tracheal deviation present. No thyromegaly present.  Cardiovascular: Normal rate, regular rhythm, normal heart sounds and intact distal pulses.  Exam reveals no gallop and no friction rub.   No murmur heard. Pulmonary/Chest: Breath sounds normal. No respiratory distress. He has no wheezes. He has no rales.  Abdominal: Soft. Bowel sounds are normal. He exhibits no mass. There is no hepatosplenomegaly. There is no tenderness. There is no rebound, no guarding and no CVA tenderness.  Musculoskeletal: Normal range of motion. He exhibits no edema or tenderness.  Lymphadenopathy:    He has no cervical adenopathy.  Neurological: He is alert and oriented to person, place, and time. He has normal sensation, normal strength, normal reflexes and intact cranial nerves. No cranial nerve deficit.  Skin: Skin is warm. No rash noted.  Psychiatric: Mood and affect normal.  Nursing note and vitals reviewed.     Assessment & Plan  Problem List Items Addressed This Visit      Cardiovascular and Mediastinum   Essential hypertension - Primary   Relevant Medications   losartan (COZAAR) 100 MG tablet   Other Relevant Orders   Renal Function Panel     Digestive  Esophageal reflux   Relevant Medications   esomeprazole (NEXIUM) 40 MG capsule     Other   Viral syndrome   Relevant Medications   acyclovir (ZOVIRAX) 200 MG capsule   Hyperlipidemia   Relevant Medications   Omega-3 1000 MG CAPS   losartan (COZAAR) 100 MG tablet   Other Relevant Orders   Lipid Profile   H/O cold sores   Relevant Medications   acyclovir (ZOVIRAX) 200 MG capsule    Other Visit Diagnoses    Need for diphtheria-tetanus-pertussis (Tdap) vaccine       multiple abrasion   Relevant Orders   Tdap vaccine greater than or equal to 7yo IM (Completed)      Meds ordered this encounter  Medications  . Omega-3 1000 MG CAPS    Sig:  Take 1 capsule (1,000 mg total) by mouth daily.    Dispense:  90 capsule    Refill:  3  . acyclovir (ZOVIRAX) 200 MG capsule    Sig: Take 1 capsule (200 mg total) by mouth daily.    Dispense:  90 capsule    Refill:  3  . esomeprazole (NEXIUM) 40 MG capsule    Sig: Take 1 capsule (40 mg total) by mouth daily at 12 noon. otc    Dispense:  90 capsule    Refill:  2  . losartan (COZAAR) 100 MG tablet    Sig: Take 1 tablet (100 mg total) by mouth daily.    Dispense:  90 tablet    Refill:  2      Dr. Otilio Miu [facility]  09/11/16

## 2016-09-12 LAB — RENAL FUNCTION PANEL
ALBUMIN: 4.8 g/dL (ref 3.5–5.5)
BUN/Creatinine Ratio: 16 (ref 9–20)
BUN: 10 mg/dL (ref 6–24)
CHLORIDE: 98 mmol/L (ref 96–106)
CO2: 25 mmol/L (ref 18–29)
Calcium: 9.6 mg/dL (ref 8.7–10.2)
Creatinine, Ser: 0.63 mg/dL — ABNORMAL LOW (ref 0.76–1.27)
GFR calc non Af Amer: 121 mL/min/{1.73_m2} (ref >59)
GFR, EST AFRICAN AMERICAN: 140 mL/min/{1.73_m2} (ref >59)
Glucose: 96 mg/dL (ref 65–99)
PHOSPHORUS: 3.8 mg/dL (ref 2.5–4.5)
Potassium: 4.6 mmol/L (ref 3.5–5.2)
Sodium: 140 mmol/L (ref 134–144)

## 2016-09-12 LAB — LIPID PANEL
Chol/HDL Ratio: 1.6 ratio (ref 0.0–5.0)
Cholesterol, Total: 212 mg/dL — ABNORMAL HIGH (ref 100–199)
HDL: 134 mg/dL (ref >39)
LDL Calculated: 71 mg/dL (ref 0–99)
TRIGLYCERIDES: 34 mg/dL (ref 0–149)
VLDL CHOLESTEROL CAL: 7 mg/dL (ref 5–40)

## 2017-02-25 ENCOUNTER — Encounter: Payer: Self-pay | Admitting: Family Medicine

## 2017-02-25 ENCOUNTER — Ambulatory Visit (INDEPENDENT_AMBULATORY_CARE_PROVIDER_SITE_OTHER): Payer: BLUE CROSS/BLUE SHIELD | Admitting: Family Medicine

## 2017-02-25 VITALS — BP 120/80 | HR 64 | Ht 67.0 in | Wt 168.0 lb

## 2017-02-25 DIAGNOSIS — J0100 Acute maxillary sinusitis, unspecified: Secondary | ICD-10-CM

## 2017-02-25 MED ORDER — AZITHROMYCIN 250 MG PO TABS: ORAL_TABLET | ORAL | 0 refills | Status: DC

## 2017-02-25 NOTE — Progress Notes (Signed)
Name: Matthew Zuniga.   MRN: [id number]    DOB: [birth year]   Date:02/25/2017       Progress Note  Subjective  Chief Complaint  Chief Complaint  Patient presents with  . Sinusitis    cough and cong    Sinusitis  This is a chronic problem. The current episode started more than 1 year ago. The problem has been gradually worsening since onset. There has been no fever. The fever has been present for 3 to 4 days. His pain is at a severity of 6/10. The pain is mild. Associated symptoms include chills, congestion and coughing. Pertinent negatives include no diaphoresis, ear pain, headaches, hoarse voice, neck pain, shortness of breath, sinus pressure, sneezing, sore throat or swollen glands. Past treatments include nothing. The treatment provided moderate relief.    No problem-specific Assessment & Plan notes found for this encounter.   Past Medical History:  Diagnosis Date  . GERD (gastroesophageal reflux disease)   . Hyperlipidemia   . Hypertension   . Viral syndrome     No past surgical history on file.  No family history on file.  Social History   Social History  . Marital status: Married    Spouse name: N/A  . Number of children: N/A  . Years of education: N/A   Occupational History  . Not on file.   Social History Main Topics  . Smoking status: Never Smoker  . Smokeless tobacco: Current User    Types: Snuff  . Alcohol use 0.0 oz/week  . Drug use: Unknown  . Sexual activity: Yes   Other Topics Concern  . Not on file   Social History Narrative  . No narrative on file    No Known Allergies  Outpatient Medications Prior to Visit  Medication Sig Dispense Refill  . acyclovir (ZOVIRAX) 200 MG capsule Take 1 capsule (200 mg total) by mouth daily. 90 capsule 3  . Bioflavonoid Products (BIOFLEX PO) Take 2 tablets by mouth daily.    Marland Kitchen esomeprazole (NEXIUM) 40 MG capsule Take 1 capsule (40 mg total) by mouth daily at 12 noon. otc 90 capsule 2  . losartan (COZAAR)  100 MG tablet Take 1 tablet (100 mg total) by mouth daily. 90 tablet 2  . Multiple Vitamin (MULTIVITAMIN WITH MINERALS) TABS tablet Take 1 tablet by mouth daily.    . Omega-3 1000 MG CAPS Take 1 capsule (1,000 mg total) by mouth daily. 90 capsule 3   No facility-administered medications prior to visit.     Review of Systems  Constitutional: Positive for chills. Negative for diaphoresis, fever, malaise/fatigue and weight loss.  HENT: Positive for congestion. Negative for ear discharge, ear pain, hoarse voice, sinus pressure, sneezing and sore throat.   Eyes: Negative for blurred vision.  Respiratory: Positive for cough. Negative for sputum production, shortness of breath and wheezing.   Cardiovascular: Negative for chest pain, palpitations and leg swelling.  Gastrointestinal: Negative for abdominal pain, blood in stool, constipation, diarrhea, heartburn, melena and nausea.  Genitourinary: Negative for dysuria, frequency, hematuria and urgency.  Musculoskeletal: Negative for back pain, joint pain, myalgias and neck pain.  Skin: Negative for rash.  Neurological: Negative for dizziness, tingling, sensory change, focal weakness and headaches.  Endo/Heme/Allergies: Negative for environmental allergies and polydipsia. Does not bruise/bleed easily.  Psychiatric/Behavioral: Negative for depression and suicidal ideas. The patient is not nervous/anxious and does not have insomnia.      Objective  Vitals:   02/25/17 1456  BP: 120/80  Pulse: 64  Weight: 168 lb (76.2 kg)  Height: 5\' 7"  (1.702 m)    Physical Exam  Constitutional: He is oriented to person, place, and time and well-developed, well-nourished, and in no distress.  HENT:  Head: Normocephalic.  Right Ear: Tympanic membrane and external ear normal.  Left Ear: Tympanic membrane and external ear normal.  Nose: Right sinus exhibits maxillary sinus tenderness. Left sinus exhibits maxillary sinus tenderness.  Mouth/Throat: Oropharynx is  clear and moist. No oropharyngeal exudate, posterior oropharyngeal edema or posterior oropharyngeal erythema.  Eyes: Pupils are equal, round, and reactive to light. Conjunctivae and EOM are normal. Right eye exhibits no discharge. Left eye exhibits no discharge. No scleral icterus.  Neck: Normal range of motion. Neck supple. No JVD present. No tracheal deviation present. No thyromegaly present.  Cardiovascular: Normal rate, regular rhythm, normal heart sounds and intact distal pulses.  Exam reveals no gallop and no friction rub.   No murmur heard. Pulmonary/Chest: Breath sounds normal. No respiratory distress. He has no wheezes. He has no rales.  Abdominal: Soft. Bowel sounds are normal. He exhibits no mass. There is no hepatosplenomegaly. There is no tenderness. There is no rebound, no guarding and no CVA tenderness.  Musculoskeletal: Normal range of motion. He exhibits no edema or tenderness.  Lymphadenopathy:    He has no cervical adenopathy.  Neurological: He is alert and oriented to person, place, and time. He has normal sensation, normal strength and intact cranial nerves.  Skin: Skin is warm. No rash noted.  Psychiatric: Mood and affect normal.  Nursing note and vitals reviewed.     Assessment & Plan  Problem List Items Addressed This Visit    None    Visit Diagnoses    Acute maxillary sinusitis, recurrence not specified    -  Primary   Relevant Medications   azithromycin (ZITHROMAX) 250 MG tablet      Meds ordered this encounter  Medications  . azithromycin (ZITHROMAX) 250 MG tablet    Sig: 2 tablets today then 1 a day for 4 days    Dispense:  6 tablet    Refill:  0      Dr. [facility]  02/25/17

## 2017-03-28 ENCOUNTER — Ambulatory Visit: Payer: BLUE CROSS/BLUE SHIELD | Admitting: Family Medicine

## 2017-04-04 ENCOUNTER — Encounter: Payer: Self-pay | Admitting: Family Medicine

## 2017-04-04 ENCOUNTER — Ambulatory Visit: Payer: BLUE CROSS/BLUE SHIELD | Admitting: Family Medicine

## 2017-04-04 VITALS — BP 120/80 | HR 72 | Ht 67.0 in | Wt 172.0 lb

## 2017-04-04 DIAGNOSIS — I10 Essential (primary) hypertension: Secondary | ICD-10-CM | POA: Diagnosis not present

## 2017-04-04 DIAGNOSIS — Z1211 Encounter for screening for malignant neoplasm of colon: Secondary | ICD-10-CM | POA: Diagnosis not present

## 2017-04-04 LAB — HEMOCCULT GUIAC POC 1CARD (OFFICE): FECAL OCCULT BLD: NEGATIVE

## 2017-04-04 MED ORDER — LOSARTAN POTASSIUM 100 MG PO TABS: 100.0000 mg | ORAL_TABLET | Freq: Every day | ORAL | 2 refills | Status: DC

## 2017-04-04 NOTE — Progress Notes (Signed)
Name: Matthew Zuniga.   MRN: [id number]    DOB: 1970/10/11   Date:04/04/2017       Progress Note  Subjective  Chief Complaint  Chief Complaint  Patient presents with  . Hypertension    Hypertension  This is a chronic problem. The current episode started more than 1 year ago. The problem is unchanged. The problem is controlled. Pertinent negatives include no anxiety, blurred vision, chest pain, headaches, malaise/fatigue, neck pain, orthopnea, palpitations, peripheral edema, PND, shortness of breath or sweats. There are no associated agents to hypertension. Risk factors for coronary artery disease include dyslipidemia. Past treatments include angiotensin blockers. The current treatment provides moderate improvement. There are no compliance problems.  There is no history of angina, kidney disease, CAD/MI, CVA, heart failure, left ventricular hypertrophy, PVD or retinopathy. There is no history of chronic renal disease, a hypertension causing med or renovascular disease.    No problem-specific Assessment & Plan notes found for this encounter.   Past Medical History:  Diagnosis Date  . GERD (gastroesophageal reflux disease)   . Hyperlipidemia   . Hypertension   . Viral syndrome     No past surgical history on file.  No family history on file.  Social History   Socioeconomic History  . Marital status: Married    Spouse name: Not on file  . Number of children: Not on file  . Years of education: Not on file  . Highest education level: Not on file  Social Needs  . Financial resource strain: Not on file  . Food insecurity - worry: Not on file  . Food insecurity - inability: Not on file  . Transportation needs - medical: Not on file  . Transportation needs - non-medical: Not on file  Occupational History  . Not on file  Tobacco Use  . Smoking status: Never Smoker  . Smokeless tobacco: Current User    Types: Snuff  Substance and Sexual Activity  . Alcohol use: Yes   Alcohol/week: 0.0 oz  . Drug use: Not on file  . Sexual activity: Yes  Other Topics Concern  . Not on file  Social History Narrative  . Not on file    No Known Allergies  Outpatient Medications Prior to Visit  Medication Sig Dispense Refill  . acyclovir (ZOVIRAX) 200 MG capsule Take 1 capsule (200 mg total) by mouth daily. 90 capsule 3  . Bioflavonoid Products (BIOFLEX PO) Take 2 tablets by mouth daily.    Marland Kitchen esomeprazole (NEXIUM) 40 MG capsule Take 1 capsule (40 mg total) by mouth daily at 12 noon. otc 90 capsule 2  . Multiple Vitamin (MULTIVITAMIN WITH MINERALS) TABS tablet Take 1 tablet by mouth daily.    . Omega-3 1000 MG CAPS Take 1 capsule (1,000 mg total) by mouth daily. 90 capsule 3  . losartan (COZAAR) 100 MG tablet Take 1 tablet (100 mg total) by mouth daily. 90 tablet 2  . azithromycin (ZITHROMAX) 250 MG tablet 2 tablets today then 1 a day for 4 days 6 tablet 0   No facility-administered medications prior to visit.     Review of Systems  Constitutional: Negative for chills, fever, malaise/fatigue and weight loss.  HENT: Negative for ear discharge, ear pain and sore throat.   Eyes: Negative for blurred vision.  Respiratory: Negative for cough, sputum production, shortness of breath and wheezing.   Cardiovascular: Negative for chest pain, palpitations, orthopnea, leg swelling and PND.  Gastrointestinal: Negative for abdominal pain, blood in stool, constipation,  diarrhea, heartburn, melena and nausea.  Genitourinary: Negative for dysuria, frequency, hematuria and urgency.  Musculoskeletal: Negative for back pain, joint pain, myalgias and neck pain.  Skin: Negative for rash.  Neurological: Negative for dizziness, tingling, sensory change, focal weakness and headaches.  Endo/Heme/Allergies: Negative for environmental allergies and polydipsia. Does not bruise/bleed easily.  Psychiatric/Behavioral: Negative for depression and suicidal ideas. The patient is not nervous/anxious  and does not have insomnia.      Objective  Vitals:   04/04/17 0841  BP: 120/80  Pulse: 72  Weight: 172 lb (78 kg)  Height: 5\' 7"  (1.702 m)    Physical Exam  Constitutional: He is oriented to person, place, and time and well-developed, well-nourished, and in no distress.  HENT:  Head: Normocephalic.  Right Ear: External ear normal.  Left Ear: External ear normal.  Nose: Nose normal.  Mouth/Throat: Oropharynx is clear and moist.  Eyes: Conjunctivae and EOM are normal. Pupils are equal, round, and reactive to light. Right eye exhibits no discharge. Left eye exhibits no discharge. No scleral icterus.  Neck: Normal range of motion. Neck supple. No JVD present. No tracheal deviation present. No thyromegaly present.  Cardiovascular: Normal rate, regular rhythm, normal heart sounds and intact distal pulses. Exam reveals no gallop and no friction rub.  No murmur heard. Pulmonary/Chest: Breath sounds normal. No respiratory distress. He has no wheezes. He has no rales.  Abdominal: Soft. Bowel sounds are normal. He exhibits no mass. There is no hepatosplenomegaly. There is no tenderness. There is no rebound, no guarding and no CVA tenderness.  Genitourinary: Rectum normal and prostate normal.  Musculoskeletal: Normal range of motion. He exhibits no edema or tenderness.  Lymphadenopathy:    He has no cervical adenopathy.  Neurological: He is alert and oriented to person, place, and time. He has normal sensation, normal strength and intact cranial nerves. No cranial nerve deficit.  Skin: Skin is warm. No rash noted.  Psychiatric: Mood and affect normal.  Nursing note and vitals reviewed.     Assessment & Plan  Problem List Items Addressed This Visit      Cardiovascular and Mediastinum   Essential hypertension - Primary   Relevant Medications   losartan (COZAAR) 100 MG tablet    Other Visit Diagnoses    Colon cancer screening       Relevant Orders   POCT occult blood stool  (Completed)      Meds ordered this encounter  Medications  . losartan (COZAAR) 100 MG tablet    Sig: Take 1 tablet (100 mg total) daily by mouth.    Dispense:  90 tablet    Refill:  2      Dr. Otilio Miu [facility]  04/04/17

## 2017-06-03 DIAGNOSIS — M546 Pain in thoracic spine: Secondary | ICD-10-CM | POA: Diagnosis not present

## 2017-06-03 DIAGNOSIS — M62830 Muscle spasm of back: Secondary | ICD-10-CM | POA: Diagnosis not present

## 2017-06-03 DIAGNOSIS — M9902 Segmental and somatic dysfunction of thoracic region: Secondary | ICD-10-CM | POA: Diagnosis not present

## 2017-06-03 DIAGNOSIS — M6080 Other myositis, unspecified site: Secondary | ICD-10-CM | POA: Diagnosis not present

## 2017-06-11 ENCOUNTER — Encounter: Payer: Self-pay | Admitting: Family Medicine

## 2017-06-11 ENCOUNTER — Ambulatory Visit (INDEPENDENT_AMBULATORY_CARE_PROVIDER_SITE_OTHER): Payer: BLUE CROSS/BLUE SHIELD | Admitting: Family Medicine

## 2017-06-11 VITALS — BP 130/110 | HR 80 | Ht 67.0 in | Wt 176.0 lb

## 2017-06-11 DIAGNOSIS — R079 Chest pain, unspecified: Secondary | ICD-10-CM | POA: Diagnosis not present

## 2017-06-11 DIAGNOSIS — J40 Bronchitis, not specified as acute or chronic: Secondary | ICD-10-CM

## 2017-06-11 DIAGNOSIS — J0100 Acute maxillary sinusitis, unspecified: Secondary | ICD-10-CM | POA: Diagnosis not present

## 2017-06-11 DIAGNOSIS — M940 Chondrocostal junction syndrome [Tietze]: Secondary | ICD-10-CM | POA: Diagnosis not present

## 2017-06-11 MED ORDER — IBUPROFEN 600 MG PO TABS: 600.0000 mg | ORAL_TABLET | Freq: Three times a day (TID) | ORAL | 0 refills | Status: DC | PRN

## 2017-06-11 MED ORDER — AMOXICILLIN-POT CLAVULANATE 875-125 MG PO TABS: 1.0000 | ORAL_TABLET | Freq: Two times a day (BID) | ORAL | 0 refills | Status: DC

## 2017-06-11 MED ORDER — GUAIFENESIN-CODEINE 100-10 MG/5ML PO SYRP: 5.0000 mL | ORAL_SOLUTION | Freq: Three times a day (TID) | ORAL | 0 refills | Status: DC | PRN

## 2017-06-11 NOTE — Progress Notes (Signed)
Name: Matthew Zuniga.   MRN: [id number]    DOB: 16-Oct-1970   Date:06/11/2017       Progress Note  Subjective  Chief Complaint  Chief Complaint  Patient presents with  . Sinusitis    R) ear pain, sore throat, cong  . Chest Pain    going on for a month- thought it was coming from hurting his back    Sinusitis  This is a new problem. The current episode started in the past 7 days. The problem has been gradually worsening since onset. There has been no fever. The fever has been present for less than 1 day. The pain is mild. Associated symptoms include congestion, coughing, sinus pressure and a sore throat. Pertinent negatives include no chills, diaphoresis, ear pain, headaches, hoarse voice, neck pain, shortness of breath, sneezing or swollen glands. (Prod green) Past treatments include oral decongestants. The treatment provided moderate relief.  Chest Pain   This is a new problem. The current episode started more than 1 month ago (before the fall/about a month). The onset quality is gradual. The problem occurs intermittently. The problem has been waxing and waning. The pain is present in the lateral region. The pain is moderate. The quality of the pain is described as sharp and stabbing. The pain does not radiate. Associated symptoms include a cough. Pertinent negatives include no abdominal pain, back pain, claudication, diaphoresis, dizziness, exertional chest pressure, fever, headaches, leg pain, malaise/fatigue, nausea, palpitations, shortness of breath or sputum production. The pain is aggravated by breathing and movement (deep breath/twist toright). There are no known risk factors.    No problem-specific Assessment & Plan notes found for this encounter.   Past Medical History:  Diagnosis Date  . GERD (gastroesophageal reflux disease)   . Hyperlipidemia   . Hypertension   . Viral syndrome     History reviewed. No pertinent surgical history.  History reviewed. No pertinent family  history.  Social History   Socioeconomic History  . Marital status: Married    Spouse name: Not on file  . Number of children: Not on file  . Years of education: Not on file  . Highest education level: Not on file  Social Needs  . Financial resource strain: Not on file  . Food insecurity - worry: Not on file  . Food insecurity - inability: Not on file  . Transportation needs - medical: Not on file  . Transportation needs - non-medical: Not on file  Occupational History  . Not on file  Tobacco Use  . Smoking status: Never Smoker  . Smokeless tobacco: Current User    Types: Snuff  Substance and Sexual Activity  . Alcohol use: Yes    Alcohol/week: 0.0 oz  . Drug use: Not on file  . Sexual activity: Yes  Other Topics Concern  . Not on file  Social History Narrative  . Not on file    No Known Allergies  Outpatient Medications Prior to Visit  Medication Sig Dispense Refill  . acyclovir (ZOVIRAX) 200 MG capsule Take 1 capsule (200 mg total) by mouth daily. 90 capsule 3  . Bioflavonoid Products (BIOFLEX PO) Take 2 tablets by mouth daily.    Marland Kitchen esomeprazole (NEXIUM) 40 MG capsule Take 1 capsule (40 mg total) by mouth daily at 12 noon. otc 90 capsule 2  . losartan (COZAAR) 100 MG tablet Take 1 tablet (100 mg total) daily by mouth. 90 tablet 2  . Multiple Vitamin (MULTIVITAMIN WITH MINERALS) TABS tablet Take  1 tablet by mouth daily.    . Omega-3 1000 MG CAPS Take 1 capsule (1,000 mg total) by mouth daily. 90 capsule 3   No facility-administered medications prior to visit.     Review of Systems  Constitutional: Negative for chills, diaphoresis, fever, malaise/fatigue and weight loss.  HENT: Positive for congestion, sinus pressure and sore throat. Negative for ear discharge, ear pain, hoarse voice and sneezing.   Eyes: Negative for blurred vision.  Respiratory: Positive for cough. Negative for sputum production, shortness of breath and wheezing.   Cardiovascular: Positive for  chest pain. Negative for palpitations, claudication and leg swelling.  Gastrointestinal: Negative for abdominal pain, blood in stool, constipation, diarrhea, heartburn, melena and nausea.  Genitourinary: Negative for dysuria, frequency, hematuria and urgency.  Musculoskeletal: Negative for back pain, joint pain, myalgias and neck pain.  Skin: Negative for rash.  Neurological: Negative for dizziness, tingling, sensory change, focal weakness and headaches.  Endo/Heme/Allergies: Negative for environmental allergies and polydipsia. Does not bruise/bleed easily.  Psychiatric/Behavioral: Negative for depression and suicidal ideas. The patient is not nervous/anxious and does not have insomnia.      Objective  Vitals:   06/11/17 0818  BP: (!) 130/110  Pulse: 80  Weight: 176 lb (79.8 kg)  Height: 5\' 7"  (1.702 m)    Physical Exam  Constitutional: He is oriented to person, place, and time and well-developed, well-nourished, and in no distress.  HENT:  Head: Normocephalic.  Right Ear: Tympanic membrane, external ear and ear canal normal.  Left Ear: Tympanic membrane, external ear and ear canal normal.  Nose: Mucosal edema present.  Mouth/Throat: Oropharynx is clear and moist. No oropharyngeal exudate, posterior oropharyngeal edema or posterior oropharyngeal erythema.  Eyes: Conjunctivae and EOM are normal. Pupils are equal, round, and reactive to light. Right eye exhibits no discharge. Left eye exhibits no discharge. No scleral icterus.  Neck: Normal range of motion. Neck supple. No JVD present. No tracheal deviation present. No thyromegaly present.  Cardiovascular: Normal rate, regular rhythm, normal heart sounds and intact distal pulses. Exam reveals no gallop and no friction rub.  No murmur heard. Pulmonary/Chest: Breath sounds normal. No respiratory distress. He has no wheezes. He has no rales. He exhibits tenderness.  Along left costochondral margin  Abdominal: Soft. Bowel sounds are  normal. He exhibits no mass. There is no hepatosplenomegaly. There is no tenderness. There is no rebound, no guarding and no CVA tenderness.  Musculoskeletal: Normal range of motion. He exhibits no edema or tenderness.  Lymphadenopathy:    He has no cervical adenopathy.  Neurological: He is alert and oriented to person, place, and time. He has normal sensation, normal strength, normal reflexes and intact cranial nerves. No cranial nerve deficit.  Skin: Skin is warm. No rash noted.  Psychiatric: Mood and affect normal.  Nursing note and vitals reviewed.     Assessment & Plan  Problem List Items Addressed This Visit    None    Visit Diagnoses    Chest pain at rest    -  Primary   Relevant Orders   EKG 12-Lead (Completed)   Acute maxillary sinusitis, recurrence not specified       Relevant Medications   amoxicillin-clavulanate (AUGMENTIN) 875-125 MG tablet   guaiFENesin-codeine (ROBITUSSIN AC) 100-10 MG/5ML syrup   Bronchitis       Relevant Medications   guaiFENesin-codeine (ROBITUSSIN AC) 100-10 MG/5ML syrup   Costochondritis       Relevant Medications   ibuprofen (ADVIL,MOTRIN) 600 MG tablet  Meds ordered this encounter  Medications  . amoxicillin-clavulanate (AUGMENTIN) 875-125 MG tablet    Sig: Take 1 tablet by mouth 2 (two) times daily.    Dispense:  20 tablet    Refill:  0  . guaiFENesin-codeine (ROBITUSSIN AC) 100-10 MG/5ML syrup    Sig: Take 5 mLs by mouth 3 (three) times daily as needed for cough.    Dispense:  150 mL    Refill:  0  . ibuprofen (ADVIL,MOTRIN) 600 MG tablet    Sig: Take 1 tablet (600 mg total) by mouth every 8 (eight) hours as needed.    Dispense:  30 tablet    Refill:  0      Dr. Sarah Zerby [facility]  06/11/17

## 2017-07-16 DIAGNOSIS — M6080 Other myositis, unspecified site: Secondary | ICD-10-CM | POA: Diagnosis not present

## 2017-07-16 DIAGNOSIS — M9902 Segmental and somatic dysfunction of thoracic region: Secondary | ICD-10-CM | POA: Diagnosis not present

## 2017-07-16 DIAGNOSIS — M546 Pain in thoracic spine: Secondary | ICD-10-CM | POA: Diagnosis not present

## 2017-07-16 DIAGNOSIS — M62830 Muscle spasm of back: Secondary | ICD-10-CM | POA: Diagnosis not present

## 2017-09-01 ENCOUNTER — Other Ambulatory Visit: Payer: Self-pay | Admitting: Family Medicine

## 2017-09-01 DIAGNOSIS — B349 Viral infection, unspecified: Secondary | ICD-10-CM

## 2017-09-01 DIAGNOSIS — Z8619 Personal history of other infectious and parasitic diseases: Secondary | ICD-10-CM

## 2017-12-19 ENCOUNTER — Other Ambulatory Visit: Payer: Self-pay | Admitting: Family Medicine

## 2017-12-19 DIAGNOSIS — Z8619 Personal history of other infectious and parasitic diseases: Secondary | ICD-10-CM

## 2017-12-19 DIAGNOSIS — B349 Viral infection, unspecified: Secondary | ICD-10-CM

## 2017-12-25 ENCOUNTER — Encounter: Payer: Self-pay | Admitting: Family Medicine

## 2017-12-25 ENCOUNTER — Ambulatory Visit: Payer: BLUE CROSS/BLUE SHIELD | Admitting: Family Medicine

## 2017-12-25 DIAGNOSIS — I10 Essential (primary) hypertension: Secondary | ICD-10-CM

## 2017-12-25 DIAGNOSIS — B349 Viral infection, unspecified: Secondary | ICD-10-CM

## 2017-12-25 DIAGNOSIS — Z8619 Personal history of other infectious and parasitic diseases: Secondary | ICD-10-CM | POA: Diagnosis not present

## 2017-12-25 MED ORDER — LOSARTAN POTASSIUM 100 MG PO TABS: 100.0000 mg | ORAL_TABLET | Freq: Every day | ORAL | 2 refills | Status: DC

## 2017-12-25 MED ORDER — ACYCLOVIR 200 MG PO CAPS: 200.0000 mg | ORAL_CAPSULE | Freq: Every day | ORAL | 3 refills | Status: DC

## 2017-12-25 NOTE — Assessment & Plan Note (Signed)
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

## 2017-12-25 NOTE — Assessment & Plan Note (Signed)
Chronic Controlled Continue losartan 100 mg daily. Check renal panel.

## 2017-12-25 NOTE — Progress Notes (Signed)
Name: Matthew Zuniga.   MRN: [id number]    DOB: Sep 13, 1970   Date:12/25/2017       Progress Note  Subjective  Chief Complaint  Chief Complaint  Patient presents with  . Hypertension  . Mouth Lesions    refill valacyclovir    Hypertension  This is a chronic problem. The current episode started more than 1 year ago. The problem is unchanged. The problem is controlled. Pertinent negatives include no anxiety, blurred vision, chest pain, headaches, malaise/fatigue, neck pain, orthopnea, palpitations, peripheral edema, PND, shortness of breath or sweats. There are no associated agents to hypertension. Risk factors for coronary artery disease include dyslipidemia and male gender. Past treatments include angiotensin blockers. The current treatment provides moderate improvement. There are no compliance problems.  There is no history of angina, kidney disease, CAD/MI, CVA, heart failure, left ventricular hypertrophy, PVD or retinopathy. There is no history of chronic renal disease, a hypertension causing med or renovascular disease.  Mouth Lesions   The onset was sudden. The problem occurs occasionally. The problem has been unchanged. The problem is moderate. The symptoms are aggravated by eating. Associated symptoms include mouth sores. Pertinent negatives include no orthopnea, no fever, no decreased vision, no double vision, no eye itching, no photophobia, no abdominal pain, no constipation, no diarrhea, no nausea, no congestion, no ear discharge, no ear pain, no headaches, no hearing loss, no rhinorrhea, no sore throat, no stridor, no swollen glands, no neck pain, no cough, no wheezing, no rash, no eye discharge and no eye redness.    No problem-specific Assessment & Plan notes found for this encounter.   Past Medical History:  Diagnosis Date  . GERD (gastroesophageal reflux disease)   . Hyperlipidemia   . Hypertension   . Viral syndrome     No past surgical history on file.  No family  history on file.  Social History   Socioeconomic History  . Marital status: Married    Spouse name: Not on file  . Number of children: Not on file  . Years of education: Not on file  . Highest education level: Not on file  Occupational History  . Not on file  Social Needs  . Financial resource strain: Not on file  . Food insecurity:    Worry: Not on file    Inability: Not on file  . Transportation needs:    Medical: Not on file    Non-medical: Not on file  Tobacco Use  . Smoking status: Never Smoker  . Smokeless tobacco: Current User    Types: Snuff  Substance and Sexual Activity  . Alcohol use: Yes    Alcohol/week: 0.0 oz  . Drug use: Not on file  . Sexual activity: Yes  Lifestyle  . Physical activity:    Days per week: Not on file    Minutes per session: Not on file  . Stress: Not on file  Relationships  . Social connections:    Talks on phone: Not on file    Gets together: Not on file    Attends religious service: Not on file    Active member of club or organization: Not on file    Attends meetings of clubs or organizations: Not on file    Relationship status: Not on file  . Intimate partner violence:    Fear of current or ex partner: Not on file    Emotionally abused: Not on file    Physically abused: Not on file  Forced sexual activity: Not on file  Other Topics Concern  . Not on file  Social History Narrative  . Not on file    No Known Allergies  Outpatient Medications Prior to Visit  Medication Sig Dispense Refill  . Bioflavonoid Products (BIOFLEX PO) Take 2 tablets by mouth daily.    Marland Kitchen esomeprazole (NEXIUM) 40 MG capsule Take 1 capsule (40 mg total) by mouth daily at 12 noon. otc 90 capsule 2  . Multiple Vitamin (MULTIVITAMIN WITH MINERALS) TABS tablet Take 1 tablet by mouth daily.    . Omega-3 1000 MG CAPS Take 1 capsule (1,000 mg total) by mouth daily. 90 capsule 3  . acyclovir (ZOVIRAX) 200 MG capsule TAKE 1 CAPSULE BY MOUTH ONCE DAILY 30  capsule 0  . losartan (COZAAR) 100 MG tablet Take 1 tablet (100 mg total) daily by mouth. 90 tablet 2  . ibuprofen (ADVIL,MOTRIN) 600 MG tablet Take 1 tablet (600 mg total) by mouth every 8 (eight) hours as needed. (Patient not taking: Reported on 12/25/2017) 30 tablet 0  . amoxicillin-clavulanate (AUGMENTIN) 875-125 MG tablet Take 1 tablet by mouth 2 (two) times daily. 20 tablet 0  . guaiFENesin-codeine (ROBITUSSIN AC) 100-10 MG/5ML syrup Take 5 mLs by mouth 3 (three) times daily as needed for cough. 150 mL 0   No facility-administered medications prior to visit.     Review of Systems  Constitutional: Negative for chills, fever, malaise/fatigue and weight loss.  HENT: Positive for mouth sores. Negative for congestion, ear discharge, ear pain, hearing loss, rhinorrhea and sore throat.   Eyes: Negative for blurred vision, double vision, photophobia, discharge, redness and itching.  Respiratory: Negative for cough, sputum production, shortness of breath, wheezing and stridor.   Cardiovascular: Negative for chest pain, palpitations, orthopnea, leg swelling and PND.  Gastrointestinal: Negative for abdominal pain, blood in stool, constipation, diarrhea, heartburn, melena and nausea.  Genitourinary: Negative for dysuria, frequency, hematuria and urgency.  Musculoskeletal: Negative for back pain, joint pain, myalgias and neck pain.  Skin: Negative for rash.  Neurological: Negative for dizziness, tingling, sensory change, focal weakness and headaches.  Endo/Heme/Allergies: Negative for environmental allergies and polydipsia. Does not bruise/bleed easily.  Psychiatric/Behavioral: Negative for depression and suicidal ideas. The patient is not nervous/anxious and does not have insomnia.      Objective  Vitals:   12/25/17 0818  BP: 112/80  Pulse: 60  Weight: 173 lb (78.5 kg)  Height: 5\' 7"  (1.702 m)    Physical Exam  Constitutional: He is oriented to person, place, and time.  HENT:  Head:  Normocephalic.  Right Ear: External ear normal.  Left Ear: External ear normal.  Nose: Nose normal.  Mouth/Throat: Oropharynx is clear and moist.  Eyes: Pupils are equal, round, and reactive to light. Conjunctivae and EOM are normal. Right eye exhibits no discharge. Left eye exhibits no discharge. No scleral icterus.  Neck: Normal range of motion. Neck supple. No JVD present. No tracheal deviation present. No thyromegaly present.  Cardiovascular: Normal rate, regular rhythm, normal heart sounds and intact distal pulses. Exam reveals no gallop and no friction rub.  No murmur heard. Pulmonary/Chest: Breath sounds normal. No respiratory distress. He has no wheezes. He has no rales.  Abdominal: Soft. Bowel sounds are normal. He exhibits no mass. There is no hepatosplenomegaly. There is no tenderness. There is no rebound, no guarding and no CVA tenderness.  Musculoskeletal: Normal range of motion. He exhibits no edema or tenderness.  Lymphadenopathy:    He has no cervical  adenopathy.  Neurological: He is alert and oriented to person, place, and time. He has normal strength and normal reflexes. No cranial nerve deficit.  Skin: Skin is warm. No rash noted.  Nursing note and vitals reviewed.     Assessment & Plan  Problem List Items Addressed This Visit      Cardiovascular and Mediastinum   Essential hypertension   Relevant Medications   losartan (COZAAR) 100 MG tablet   Other Relevant Orders   Renal Function Panel     Other   Viral syndrome   Relevant Medications   acyclovir (ZOVIRAX) 200 MG capsule   H/O cold sores   Relevant Medications   acyclovir (ZOVIRAX) 200 MG capsule      Meds ordered this encounter  Medications  . acyclovir (ZOVIRAX) 200 MG capsule    Sig: Take 1 capsule (200 mg total) by mouth daily.    Dispense:  90 capsule    Refill:  3    sched appt for meds  . losartan (COZAAR) 100 MG tablet    Sig: Take 1 tablet (100 mg total) by mouth daily.    Dispense:   90 tablet    Refill:  2      Dr. Otilio Miu [facility]  12/25/17

## 2017-12-26 LAB — RENAL FUNCTION PANEL
ALBUMIN: 4.8 g/dL (ref 3.5–5.5)
BUN/Creatinine Ratio: 14 (ref 9–20)
BUN: 11 mg/dL (ref 6–24)
CHLORIDE: 100 mmol/L (ref 96–106)
CO2: 23 mmol/L (ref 20–29)
Calcium: 9.6 mg/dL (ref 8.7–10.2)
Creatinine, Ser: 0.79 mg/dL (ref 0.76–1.27)
GFR calc non Af Amer: 109 mL/min/{1.73_m2} (ref >59)
GFR, EST AFRICAN AMERICAN: 126 mL/min/{1.73_m2} (ref >59)
GLUCOSE: 89 mg/dL (ref 65–99)
POTASSIUM: 4.5 mmol/L (ref 3.5–5.2)
Phosphorus: 3.4 mg/dL (ref 2.5–4.5)
Sodium: 140 mmol/L (ref 134–144)

## 2017-12-30 ENCOUNTER — Other Ambulatory Visit: Payer: Self-pay | Admitting: Family Medicine

## 2017-12-30 DIAGNOSIS — R21 Rash and other nonspecific skin eruption: Secondary | ICD-10-CM

## 2017-12-30 MED ORDER — CLOTRIMAZOLE-BETAMETHASONE 1-0.05 % EX CREA: 1.0000 "application " | TOPICAL_CREAM | Freq: Two times a day (BID) | CUTANEOUS | 0 refills | Status: DC

## 2018-02-20 ENCOUNTER — Other Ambulatory Visit: Payer: Self-pay

## 2018-02-20 DIAGNOSIS — B349 Viral infection, unspecified: Secondary | ICD-10-CM

## 2018-02-20 DIAGNOSIS — Z8619 Personal history of other infectious and parasitic diseases: Secondary | ICD-10-CM

## 2018-02-20 MED ORDER — ACYCLOVIR 200 MG PO CAPS: 200.0000 mg | ORAL_CAPSULE | Freq: Every day | ORAL | 1 refills | Status: DC

## 2018-04-14 ENCOUNTER — Encounter: Payer: Self-pay | Admitting: Family Medicine

## 2018-04-14 ENCOUNTER — Ambulatory Visit: Payer: BLUE CROSS/BLUE SHIELD | Admitting: Family Medicine

## 2018-04-14 VITALS — BP 130/80 | HR 76 | Ht 67.0 in | Wt 172.0 lb

## 2018-04-14 DIAGNOSIS — J0100 Acute maxillary sinusitis, unspecified: Secondary | ICD-10-CM

## 2018-04-14 MED ORDER — AMOXICILLIN-POT CLAVULANATE 875-125 MG PO TABS: 1.0000 | ORAL_TABLET | Freq: Two times a day (BID) | ORAL | 0 refills | Status: DC

## 2018-04-14 NOTE — Progress Notes (Signed)
Date:  04/14/2018   Name:  Matthew Zuniga.   DOB:  10/02/1970   MRN:  [id number]   Chief Complaint: Sinusitis Sinusitis  This is a new problem. The current episode started in the past 7 days. The problem has been gradually worsening since onset. There has been no fever. The fever has been present for less than 1 day. The pain is mild. Pertinent negatives include no chills, congestion, coughing, diaphoresis, ear pain, headaches, hoarse voice, neck pain, shortness of breath, sinus pressure, sneezing, sore throat or swollen glands. Past treatments include nothing. The treatment provided moderate relief.     Review of Systems  Constitutional: Negative for appetite change, chills, diaphoresis, fatigue, fever and unexpected weight change.  HENT: Negative for congestion, drooling, ear discharge, ear pain, facial swelling, hearing loss, hoarse voice, nosebleeds, sinus pressure, sneezing, sore throat and trouble swallowing.   Eyes: Negative for photophobia, pain, discharge, redness, itching and visual disturbance.  Respiratory: Negative for cough, choking, chest tightness, shortness of breath and wheezing.   Cardiovascular: Negative for chest pain, palpitations and leg swelling.  Gastrointestinal: Negative for abdominal pain, blood in stool, constipation, diarrhea, nausea, rectal pain and vomiting.  Endocrine: Negative for cold intolerance, heat intolerance, polydipsia, polyphagia and polyuria.  Genitourinary: Negative for decreased urine volume, difficulty urinating, discharge, dysuria, flank pain, frequency, hematuria, penile pain, penile swelling, scrotal swelling, testicular pain and urgency.  Musculoskeletal: Negative for back pain, joint swelling, myalgias, neck pain and neck stiffness.  Skin: Negative for color change and rash.  Allergic/Immunologic: Negative for environmental allergies and immunocompromised state.  Neurological: Negative for dizziness, tremors, seizures, syncope,  speech difficulty, weakness, light-headedness, numbness and headaches.  Hematological: Does not bruise/bleed easily.  Psychiatric/Behavioral: Negative for agitation, behavioral problems, confusion, dysphoric mood, hallucinations, self-injury and suicidal ideas. The patient is not nervous/anxious.     Patient Active Problem List   Diagnosis Date Noted  . Hyperlipidemia 09/11/2016  . 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 Essential hypertension 12/02/2015  . Esophageal reflux 12/02/2015  . Viral syndrome 12/02/2015    No Known Allergies  No past surgical history on file.  Social History   Tobacco Use  . Smoking status: Never Smoker  . Smokeless tobacco: Current User    Types: Snuff  Substance Use Topics  . Alcohol use: Yes    Alcohol/week: 0.0 standard drinks  . Drug use: Not on file     Medication list has been reviewed and updated.  Current Meds  Medication Sig  . acyclovir (ZOVIRAX) 200 MG capsule Take 1 capsule (200 mg total) by mouth daily.  . clotrimazole-betamethasone (LOTRISONE) cream Apply 1 application topically 2 (two) times daily.  Marland Kitchen esomeprazole (NEXIUM) 40 MG capsule Take 1 capsule (40 mg total) by mouth daily at 12 noon. otc  . losartan (COZAAR) 100 MG tablet Take 1 tablet (100 mg total) by mouth daily.  . Multiple Vitamin (MULTIVITAMIN WITH MINERALS) TABS tablet Take 1 tablet by mouth daily.  . Omega-3 1000 MG CAPS Take 1 capsule (1,000 mg total) by mouth daily.  . [DISCONTINUED] Bioflavonoid Products (BIOFLEX PO) Take 2 tablets by mouth daily.    PHQ 2/9 Scores 02/25/2017 02/25/2017  PHQ - 2 Score 0 0  PHQ- 9 Score 0 -    Physical Exam  Constitutional: He is oriented to person, place, and time.  HENT:  Head: Normocephalic.  Right Ear: External ear normal.  Left Ear: External ear normal.  Nose: Nose normal.  Mouth/Throat: Oropharynx is clear  and moist.  Eyes: Pupils are equal, round, and reactive to light. Conjunctivae and EOM are normal. Right eye exhibits  no discharge. Left eye exhibits no discharge. No scleral icterus.  Neck: Normal range of motion. Neck supple. No JVD present. No tracheal deviation present. No thyromegaly present.  Cardiovascular: Normal rate, regular rhythm, normal heart sounds and intact distal pulses. Exam reveals no gallop and no friction rub.  No murmur heard. Pulmonary/Chest: Breath sounds normal. No respiratory distress. He has no wheezes. He has no rales.  Abdominal: Soft. Bowel sounds are normal. He exhibits no mass. There is no hepatosplenomegaly. There is no tenderness. There is no rebound, no guarding and no CVA tenderness.  Musculoskeletal: Normal range of motion. He exhibits no edema or tenderness.  Lymphadenopathy:    He has no cervical adenopathy.  Neurological: He is alert and oriented to person, place, and time. He has normal strength and normal reflexes. No cranial nerve deficit.  Skin: Skin is warm. No rash noted.  Nursing note and vitals reviewed.   BP 130/80   Pulse 76   Ht 5\' 7"  (1.702 m)   Wt 172 lb (78 kg)   BMI 26.94 kg/m   Assessment and Plan:   1. Acute maxillary sinusitis, recurrence not specified Acute persistent prescribed Augmentin 875 twice daily for 10 days.  [place] DM as needed cough. - amoxicillin-clavulanate (AUGMENTIN) 875-125 MG tablet; Take 1 tablet by mouth 2 (two) times daily.  Dispense: 20 tablet; Refill: 0   Dr. [facility]  04/14/2018

## 2018-07-04 ENCOUNTER — Encounter: Payer: Self-pay | Admitting: Surgery

## 2018-07-04 ENCOUNTER — Ambulatory Visit: Payer: BLUE CROSS/BLUE SHIELD | Admitting: Family Medicine

## 2018-07-04 ENCOUNTER — Encounter: Payer: Self-pay | Admitting: Family Medicine

## 2018-07-04 ENCOUNTER — Other Ambulatory Visit: Payer: Self-pay

## 2018-07-04 ENCOUNTER — Ambulatory Visit (INDEPENDENT_AMBULATORY_CARE_PROVIDER_SITE_OTHER): Payer: BLUE CROSS/BLUE SHIELD | Admitting: Surgery

## 2018-07-04 VITALS — BP 110/70 | HR 72 | Ht 67.0 in | Wt 178.0 lb

## 2018-07-04 VITALS — BP 123/90 | HR 92 | Temp 97.7°F | Ht 68.0 in | Wt 178.0 lb

## 2018-07-04 DIAGNOSIS — K645 Perianal venous thrombosis: Secondary | ICD-10-CM

## 2018-07-04 MED ORDER — HYDROCORTISONE ACETATE 25 MG RE SUPP: 25.0000 mg | Freq: Two times a day (BID) | RECTAL | 0 refills | Status: DC

## 2018-07-04 NOTE — Patient Instructions (Signed)
Rx sent to [facility]. Use a stool softener as needed..Return as needed.  The patient is aware to call back for any questions or concerns.

## 2018-07-04 NOTE — Progress Notes (Signed)
Date:  07/04/2018   Name:  Matthew Zuniga.   DOB:  [birth year]   MRN:  [id number]   Chief Complaint: Hemorrhoids (has been using prep H suppositories so they have calmed down some, but they are still there. No blood in stool that he has noticed.)  Rectal Bleeding   The current episode started 3 to 5 days ago (for rectal swelling/4 days). The onset was gradual. The problem has been gradually improving ( pain/swelling decreased overnight). The pain is moderate. Treatments tried: suppositories. Associated symptoms include hemorrhoids. Pertinent negatives include no anorexia, no fever, no abdominal pain, no diarrhea, no nausea, no hematuria, no chest pain, no headaches, no coughing and no rash.    Review of Systems  Constitutional: Negative for chills and fever.  HENT: Negative for drooling, ear discharge, ear pain and sore throat.   Respiratory: Negative for cough, shortness of breath and wheezing.   Cardiovascular: Negative for chest pain, palpitations and leg swelling.  Gastrointestinal: Positive for hematochezia and hemorrhoids. Negative for abdominal pain, anorexia, blood in stool, constipation, diarrhea and nausea.  Endocrine: Negative for polydipsia.  Genitourinary: Negative for dysuria, frequency, hematuria and urgency.  Musculoskeletal: Negative for back pain, myalgias and neck pain.  Skin: Negative for rash.  Allergic/Immunologic: Negative for environmental allergies.  Neurological: Negative for dizziness and headaches.  Hematological: Does not bruise/bleed easily.  Psychiatric/Behavioral: Negative for suicidal ideas. The patient is not nervous/anxious.     Patient Active Problem List   Diagnosis Date Noted  . Hyperlipidemia 09/11/2016  . 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 Essential hypertension 12/02/2015  . Esophageal reflux 12/02/2015  . Viral syndrome 12/02/2015    No Known Allergies  No past surgical history on file.  Social History   Tobacco Use  . Smoking  status: Never Smoker  . Smokeless tobacco: Current User    Types: Snuff  Substance Use Topics  . Alcohol use: Yes    Alcohol/week: 0.0 standard drinks  . Drug use: Not on file     Medication list has been reviewed and updated.  Current Meds  Medication Sig  . acyclovir (ZOVIRAX) 200 MG capsule Take 1 capsule (200 mg total) by mouth daily.  . clotrimazole-betamethasone (LOTRISONE) cream Apply 1 application topically 2 (two) times daily.  Marland Kitchen esomeprazole (NEXIUM) 40 MG capsule Take 1 capsule (40 mg total) by mouth daily at 12 noon. otc  . Multiple Vitamin (MULTIVITAMIN WITH MINERALS) TABS tablet Take 1 tablet by mouth daily.  . Omega-3 1000 MG CAPS Take 1 capsule (1,000 mg total) by mouth daily.    PHQ 2/9 Scores 02/25/2017 02/25/2017  PHQ - 2 Score 0 0  PHQ- 9 Score 0 -    Physical Exam Vitals signs and nursing note reviewed.  HENT:     Head: Normocephalic.     Right Ear: Tympanic membrane, ear canal and external ear normal.     Left Ear: Tympanic membrane, ear canal and external ear normal.     Nose: Nose normal.  Eyes:     General: No scleral icterus.       Right eye: No discharge.        Left eye: No discharge.     Conjunctiva/sclera: Conjunctivae normal.     Pupils: Pupils are equal, round, and reactive to light.  Neck:     Musculoskeletal: Normal range of motion and neck supple.     Thyroid: No thyromegaly.     Vascular: No JVD.  Trachea: No tracheal deviation.  Cardiovascular:     Rate and Rhythm: Normal rate and regular rhythm.     Heart sounds: Normal heart sounds. No murmur. No friction rub. No gallop.   Pulmonary:     Effort: No respiratory distress.     Breath sounds: Normal breath sounds. No wheezing or rales.  Abdominal:     General: Bowel sounds are normal.     Palpations: Abdomen is soft. There is no mass.     Tenderness: There is no abdominal tenderness. There is no guarding or rebound.  Genitourinary:    Prostate: Normal.     Rectum: Guaiac  result negative. Tenderness and external hemorrhoid present. No mass or anal fissure.     Comments: Small bulge at 11 o'clock/palpable edge/tag internally/tender Musculoskeletal: Normal range of motion.        General: No tenderness.  Lymphadenopathy:     Cervical: No cervical adenopathy.  Skin:    General: Skin is warm.     Findings: No rash.  Neurological:     Mental Status: He is alert and oriented to person, place, and time.     Cranial Nerves: No cranial nerve deficit.     Deep Tendon Reflexes: Reflexes are normal and symmetric.     BP 110/70   Pulse 72   Ht 5\' 7"  (1.702 m)   Wt 178 lb (80.7 kg)   BMI 27.88 kg/m   Assessment and Plan:  1. Hemorrhoids, external, thrombosed Patient has been treating himself for a hemorrhoid and over the night there is been some improvement with Anusol suppositories with a decrease in the swelling and the tenderness.  However remains some swelling and tenderness and I am uncertain if this is a small abscess that he had drainage it feels like there if there is a little hole that inside the perianal area or a thrombosed hemorrhoid I suspect the former.  Will refer to general surgery for evaluation and possible further treatment. - Ambulatory referral to General Surgery

## 2018-07-04 NOTE — Progress Notes (Signed)
07/04/2018  Reason for Visit: Thrombosed hemorrhoid  Referring provider: Otilio Miu, MD  History of Present Illness: Matthew Aguila. is a 48 y.o. male presenting for evaluation of possible thrombosed hemorrhoid.  Patient reports that started about 5 days ago with pain in the perianal area.  It peaked 2 days ago and today has been better but he still feels some discomfort.  It is worse when he sitting.  He denies any drainage of purulent fluid, denies any fevers or chills.  Reports that he has been using Preparation H suppository which is helping.  He saw his PCP today and was referred to [place] urgently for evaluation.  Past Medical History: Past Medical History:  Diagnosis Date  . GERD (gastroesophageal reflux disease)   . Hyperlipidemia   . Hypertension   . Viral syndrome      Past Surgical History: History reviewed. No pertinent surgical history.  Home Medications: Prior to Admission medications   Medication Sig Start Date End Date Taking? Authorizing Provider  acyclovir (ZOVIRAX) 200 MG capsule Take 1 capsule (200 mg total) by mouth daily. 02/20/18  Yes Juline Patch, MD  esomeprazole (NEXIUM) 40 MG capsule Take 1 capsule (40 mg total) by mouth daily at 12 noon. otc 09/11/16  Yes Juline Patch, MD  ibuprofen (ADVIL,MOTRIN) 600 MG tablet Take 1 tablet (600 mg total) by mouth every 8 (eight) hours as needed. 06/11/17  Yes Juline Patch, MD  losartan (COZAAR) 100 MG tablet Take 1 tablet (100 mg total) by mouth daily. 12/25/17  Yes Juline Patch, MD  Multiple Vitamin (MULTIVITAMIN WITH MINERALS) TABS tablet Take 1 tablet by mouth daily.   Yes [provider]  Omega-3 1000 MG CAPS Take 1 capsule (1,000 mg total) by mouth daily. 09/11/16  Yes Juline Patch, MD  hydrocortisone (ANUSOL-HC) 25 MG suppository Place 1 suppository (25 mg total) rectally 2 (two) times daily. 07/04/18   Olean Ree, MD    Allergies: No Known Allergies  Social History:  reports that he has  never smoked. His smokeless tobacco use includes snuff. He reports current alcohol use. No history on file for drug.   Family History: History reviewed. No pertinent family history.  Review of Systems: Review of Systems  Constitutional: Negative for chills and fever.  Respiratory: Negative for shortness of breath.   Cardiovascular: Negative for chest pain.  Gastrointestinal: Negative for abdominal pain, constipation, nausea and vomiting.    Physical Exam BP 123/90   Pulse 92   Temp 97.7 F (36.5 C) (Skin)   Ht 5\' 8"  (1.727 m)   Wt 178 lb (80.7 kg)   SpO2 98%   BMI 27.06 kg/m  CONSTITUTIONAL: No acute distress HEENT:  Normocephalic, atraumatic, extraocular motion intact. NECK: Trachea is midline, and there is no jugular venous distension.  RESPIRATORY:  Lungs are clear, and breath sounds are equal bilaterally. Normal respiratory effort without pathologic use of accessory muscles. CARDIOVASCULAR: Heart is regular without murmurs, gallops, or rubs. GI: The abdomen is off, nondistended, nontender.  RECTAL: External exam reveals a posterior area that is somewhat sore although not significantly enlarged.  Rectal exam reveals tenderness in the posterior area but no mass lesions.  There is no evidence of any abscess.  There is no erythema or induration. MUSCULOSKELETAL:  Normal muscle strength and tone in all four extremities.  No peripheral edema or cyanosis. SKIN: Skin turgor is normal. There are no pathologic skin lesions.  NEUROLOGIC:  Motor and sensation is grossly  normal.  Cranial nerves are grossly intact. PSYCH:  Alert and oriented to person, place and time. Affect is normal.  Laboratory Analysis: No results found for this or any previous visit (from the past 24 hour(s)).  Imaging: No results found.  Assessment and Plan: This is a 48 y.o. male with likely a thrombosed hemorrhoid that is now resolving.  This point there is no acute procedures to do.  He may have had a  thrombosed hemorrhoid but does see there is no evidence of any possibility for an abscess.  We will prescribe the patient Anusol to help in case this was thrombosed hemorrhoid.  Discussed with him that he should take MiraLAX or stool softeners to keep her stool soft so he is not constipated.  At this point does not need any pain medication.  He may follow-up on an as-needed basis  Face-to-face time spent with the patient and care providers was 40 minutes, with more than 50% of the time spent counseling, educating, and coordinating care of the patient.     Melvyn Neth, [facility]

## 2018-07-16 ENCOUNTER — Ambulatory Visit (INDEPENDENT_AMBULATORY_CARE_PROVIDER_SITE_OTHER): Payer: BLUE CROSS/BLUE SHIELD | Admitting: Surgery

## 2018-07-16 ENCOUNTER — Encounter: Payer: Self-pay | Admitting: Surgery

## 2018-07-16 ENCOUNTER — Telehealth: Payer: Self-pay | Admitting: *Deleted

## 2018-07-16 ENCOUNTER — Other Ambulatory Visit: Payer: Self-pay

## 2018-07-16 VITALS — BP 120/70 | HR 89 | Temp 97.7°F | Resp 15

## 2018-07-16 DIAGNOSIS — K602 Anal fissure, unspecified: Secondary | ICD-10-CM | POA: Diagnosis not present

## 2018-07-16 MED ORDER — LIDOCAINE-HYDROCORTISONE ACE 3-0.5 % RE CREA: 1.0000 | TOPICAL_CREAM | Freq: Two times a day (BID) | RECTAL | 0 refills | Status: DC

## 2018-07-16 NOTE — Progress Notes (Signed)
07/16/2018  History of Present Illness: Matthew Zuniga. is a 48 y.o. male seen two weeks ago for a possible thrombosed hemorrhoid.  He was given prescription for Anusol and bowel regimen instructions.  He reports he used the Anusol and did not help him.  He maintains a soft bowel movement which is about twice daily and he denies having to strain much.  He reports still having pain in the posterior side.  He reports that the pain is not with bowel movement but more with wiping and when touching the area.  Denies any blood in the stool.  Past Medical History: Past Medical History:  Diagnosis Date  . GERD (gastroesophageal reflux disease)   . Hyperlipidemia   . Hypertension   . Viral syndrome      Past Surgical History: No past surgical history on file.  Home Medications: Prior to Admission medications   Medication Sig Start Date End Date Taking? Authorizing Provider  acyclovir (ZOVIRAX) 200 MG capsule Take 1 capsule (200 mg total) by mouth daily. 02/20/18  Yes Juline Patch, MD  esomeprazole (NEXIUM) 40 MG capsule Take 1 capsule (40 mg total) by mouth daily at 12 noon. otc 09/11/16  Yes Juline Patch, MD  hydrocortisone (ANUSOL-HC) 25 MG suppository Place 1 suppository (25 mg total) rectally 2 (two) times daily. 07/04/18  Yes Fannie Alomar, Jacqulyn Bath, MD  ibuprofen (ADVIL,MOTRIN) 600 MG tablet Take 1 tablet (600 mg total) by mouth every 8 (eight) hours as needed. 06/11/17  Yes Juline Patch, MD  losartan (COZAAR) 100 MG tablet Take 1 tablet (100 mg total) by mouth daily. 12/25/17  Yes Juline Patch, MD  Multiple Vitamin (MULTIVITAMIN WITH MINERALS) TABS tablet Take 1 tablet by mouth daily.   Yes [provider]  Omega-3 1000 MG CAPS Take 1 capsule (1,000 mg total) by mouth daily. 09/11/16  Yes Juline Patch, MD  lidocaine-hydrocortisone (ANAMANTEL HC) 3-0.5 % CREA Place 1 Applicatorful rectally 2 (two) times daily. 07/16/18   Olean Ree, MD    Allergies: No Known  Allergies  Review of Systems: Review of Systems  Constitutional: Negative for chills and fever.  Respiratory: Negative for shortness of breath.   Cardiovascular: Negative for chest pain.  Gastrointestinal: Negative for abdominal pain, blood in stool, constipation, nausea and vomiting.    Physical Exam BP 120/70   Pulse 89   Temp 97.7 F (36.5 C)   Resp 15   SpO2 98%  CONSTITUTIONAL: No acute distress HEENT:  Normocephalic, atraumatic, extraocular motion intact. RESPIRATORY:  Lungs are clear, and breath sounds are equal bilaterally. Normal respiratory effort without pathologic use of accessory muscles. CARDIOVASCULAR: Heart is regular without murmurs, gallops, or rubs. GI: The abdomen is soft, nondistended nontender.  RECTAL:  External exam again reveals an area of swelling on the posterior side, which is tender to palpation.  There is no fluctuance to indicate an abscess or any erythema or induration.  Spreading the buttocks and anal verge better, there is a small posterior fissure which appears to be healing.  Digital exam does not reveal a tight rectal tone and does not reveal any masses. NEUROLOGIC:  Motor and sensation is grossly normal.  Cranial nerves are grossly intact. PSYCH:  Alert and oriented to person, place and time. Affect is normal.  Labs/Imaging: None  Assessment and Plan: This is a 48 y.o. male with an anal fissure and possible posterior sentinel tag.  Discussed with the patient that though I see a posterior fissure, he  is not behaving like the typical fissure patient.  His pain is not with bowel movement, his rectal tone is not too tight and DRE was not too difficult.  The pain is more exterior at the area of swelling.  This in theory could still be an external hemorrhoid, but symptomatic sentinel tag, but cannot be sure.  No procedures are needed at this point, and will change his medication to lidocaine-hydrocortisone ointment so that he can apply it directly where  it's sore rather than use a suppository that may not be helping exteriorly.  Advised to continue bowel regimen to keep stool soft and also do Sitz baths twice daily to help soothe the tissue.  He will follow up in two weeks. If no improvement, we may need to do anoscopy vs EUA in OR.  Face-to-face time spent with the patient and care providers was 15 minutes, with more than 50% of the time spent counseling, educating, and coordinating care of the patient.     Melvyn Neth, [facility]

## 2018-07-16 NOTE — Telephone Encounter (Signed)
Patient called and stated that he was prescribed suppository on 07/04/18 and they are not working, he wants to try something different. Please call and advise

## 2018-07-16 NOTE — Patient Instructions (Addendum)
Use cream twice daily placing on the most sensitive area.  Continue with sitz baths. Continue to keep stools soft so you do not strain with bowel movements.  Follow up in 2 weeks      Anal Fissure, Adult  An anal fissure is a small tear or crack in the tissue around the opening of the butt (anus). Bleeding from the tear or crack usually stops on its own within a few minutes. The bleeding may happen every time you poop (have a bowel movement) until the tear or crack heals. What are the causes? This condition is usually caused by passing a large or hard poop (stool). Other causes include:  Trouble pooping (constipation).  Passing watery poop (diarrhea).  Inflammatory bowel disease (Crohn's disease or ulcerative colitis).  Childbirth.  Infections.  Anal sex. What are the signs or symptoms? Symptoms of this condition include:  Bleeding from the butt.  Small amounts of blood on your poop. The blood coats the outside of the poop. It is not mixed with the poop.  Small amounts of blood on the toilet paper or in the toilet after you poop.  Pain when passing poop.  Itching or irritation around the opening of the butt. How is this diagnosed? This condition may be diagnosed based on a physical exam. Your doctor may:  Check your butt. A tear can often be seen by checking the area with care.  Check your butt using a short tube (anoscope). The light in the tube will show any problems in your butt. How is this treated? Treatment for this condition may include:  Treating problems that make it hard for you to pass poop. You may be told to: ? Eat more fiber. ? Drink more fluid. ? Take fiber supplements. ? Take medicines that make poop soft.  Taking sitz baths. This may help to heal the tear.  Using creams and ointments. If your condition gets worse, other treatments may be needed such as:  A shot near the tear or crack (botulinum injection).  Surgery to repair the tear or  crack. Follow these instructions at home: Eating and drinking   Avoid bananas and dairy products. These foods can make it hard to poop.  Drink enough fluid to keep your pee (urine) pale yellow.  Eat foods that have a lot of fiber in them, such as: ? Beans. ? Whole grains. ? Fresh fruits. ? Fresh vegetables. General instructions   Take over-the-counter and prescription medicines only as told by your doctor.  Use creams or ointments only as told by your doctor.  Keep the butt area as clean and dry as you can.  Take a warm water bath (sitz bath) as told by your doctor. Do not use soap.  Keep all follow-up visits as told by your doctor. This is important. Contact a doctor if:  You have more bleeding.  You have a fever.  You have watery poop that is mixed with blood.  You have pain.  Your problem gets worse, not better. Summary  An anal fissure is a small tear or crack in the skin around the opening of the butt (anus).  This condition is usually caused by passing a large or hard poop (stool).  Treatment includes treating the problems that make it hard for you to pass poop.  Follow your doctor's instructions about caring for your condition at home.  Keep all follow-up visits as told by your doctor. This is important. This information is not intended to  replace advice given to you by your health care provider. Make sure you discuss any questions you have with your health care provider. Document Released: 01/03/2011 Document Revised: 10/17/2017 Document Reviewed: 10/17/2017 Elsevier Interactive Patient Education  2019 [employer].

## 2018-07-30 ENCOUNTER — Ambulatory Visit: Payer: BLUE CROSS/BLUE SHIELD | Admitting: Surgery

## 2018-09-01 ENCOUNTER — Other Ambulatory Visit: Payer: Self-pay

## 2018-09-01 ENCOUNTER — Ambulatory Visit (INDEPENDENT_AMBULATORY_CARE_PROVIDER_SITE_OTHER): Payer: BLUE CROSS/BLUE SHIELD | Admitting: Family Medicine

## 2018-09-01 ENCOUNTER — Encounter: Payer: Self-pay | Admitting: Family Medicine

## 2018-09-01 VITALS — Temp 98.2°F | Ht 67.0 in | Wt 175.0 lb

## 2018-09-01 DIAGNOSIS — J0100 Acute maxillary sinusitis, unspecified: Secondary | ICD-10-CM | POA: Diagnosis not present

## 2018-09-01 MED ORDER — AMOXICILLIN-POT CLAVULANATE 875-125 MG PO TABS: 1.0000 | ORAL_TABLET | Freq: Two times a day (BID) | ORAL | 0 refills | Status: DC

## 2018-09-01 NOTE — Progress Notes (Signed)
Date:  09/01/2018   Name:  Matthew Zuniga.   DOB:  1971/01/06   MRN:  [id number]   Chief Complaint: Sinusitis (sinus drainage in throat, headache, no cough, colored production from drainage/ no travel, no fever, no exposure)  I connected withthis patient, Matthew Zuniga, by telephoneat the patient's office/home.  I verified that I am speaking with the correct person using two identifiers. This visit was conducted via telephone due to the Covid-19 outbreak from my office at [facility] in [place], [place]. I discussed the limitations, risks, security and privacy concerns of performing an evaluation and management service by telephone. I also discussed with the patient that there may be a patient responsible charge related to this service. The patient expressed understanding and agreed to proceed.  Sinusitis  This is a new problem. The current episode started in the past 7 days (sat am). The problem has been gradually worsening since onset. There has been no fever. The pain is mild. Associated symptoms include congestion, headaches, a hoarse voice, sinus pressure and a sore throat. Pertinent negatives include no chills, coughing, diaphoresis, ear pain, neck pain, shortness of breath, sneezing or swollen glands. Past treatments include antibiotics (resumed amoxil). The treatment provided no relief.    Review of Systems  Constitutional: Negative for chills, diaphoresis and fever.  HENT: Positive for congestion, hoarse voice, sinus pressure and sore throat. Negative for drooling, ear discharge, ear pain and sneezing.   Respiratory: Negative for cough, shortness of breath and wheezing.   Cardiovascular: Negative for chest pain, palpitations and leg swelling.  Gastrointestinal: Negative for abdominal pain, blood in stool, constipation, diarrhea and nausea.  Endocrine: Negative for polydipsia.  Genitourinary: Negative for dysuria, frequency, hematuria and urgency.  Musculoskeletal:  Negative for back pain, myalgias and neck pain.  Skin: Negative for rash.  Allergic/Immunologic: Negative for environmental allergies.  Neurological: Positive for headaches. Negative for dizziness.  Hematological: Does not bruise/bleed easily.  Psychiatric/Behavioral: Negative for suicidal ideas. The patient is not nervous/anxious.     Patient Active Problem List   Diagnosis Date Noted  . Hyperlipidemia 09/11/2016  . 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 Essential hypertension 12/02/2015  . Esophageal reflux 12/02/2015  . Viral syndrome 12/02/2015    No Known Allergies  No past surgical history on file.  Social History   Tobacco Use  . Smoking status: Never Smoker  . Smokeless tobacco: Current User    Types: Snuff  Substance Use Topics  . Alcohol use: Yes    Alcohol/week: 0.0 standard drinks  . Drug use: Not on file     Medication list has been reviewed and updated.  Current Meds  Medication Sig  . acyclovir (ZOVIRAX) 200 MG capsule Take 1 capsule (200 mg total) by mouth daily.  Marland Kitchen esomeprazole (NEXIUM) 40 MG capsule Take 1 capsule (40 mg total) by mouth daily at 12 noon. otc  . ibuprofen (ADVIL,MOTRIN) 600 MG tablet Take 1 tablet (600 mg total) by mouth every 8 (eight) hours as needed.  Marland Kitchen losartan (COZAAR) 100 MG tablet Take 1 tablet (100 mg total) by mouth daily.  . Multiple Vitamin (MULTIVITAMIN WITH MINERALS) TABS tablet Take 1 tablet by mouth daily.  . Omega-3 1000 MG CAPS Take 1 capsule (1,000 mg total) by mouth daily.    PHQ 2/9 Scores 02/25/2017 02/25/2017  PHQ - 2 Score 0 0  PHQ- 9 Score 0 -    BP Readings from Last 3 Encounters:  07/16/18 120/70  07/04/18 123/90  07/04/18 110/70    Physical Exam Vitals signs and nursing note reviewed.  HENT:     Head: Normocephalic.     Right Ear: Tympanic membrane, ear canal and external ear normal.     Left Ear: Tympanic membrane, ear canal and external ear normal.     Nose: Nose normal.  Eyes:     General: No scleral  icterus.       Right eye: No discharge.        Left eye: No discharge.     Conjunctiva/sclera: Conjunctivae normal.     Pupils: Pupils are equal, round, and reactive to light.  Neck:     Musculoskeletal: Normal range of motion and neck supple.     Thyroid: No thyromegaly.     Vascular: No carotid bruit or JVD.     Trachea: No tracheal deviation.  Cardiovascular:     Rate and Rhythm: Normal rate and regular rhythm.     Heart sounds: Normal heart sounds. No murmur. No friction rub. No gallop.   Pulmonary:     Effort: No respiratory distress.     Breath sounds: Normal breath sounds. No wheezing or rales.  Abdominal:     General: Bowel sounds are normal.     Palpations: Abdomen is soft. There is no mass.     Tenderness: There is no abdominal tenderness. There is no guarding or rebound.  Musculoskeletal: Normal range of motion.        General: No tenderness.  Lymphadenopathy:     Cervical: No cervical adenopathy.  Skin:    General: Skin is warm.     Findings: No rash.  Neurological:     Mental Status: He is alert and oriented to person, place, and time.     Cranial Nerves: No cranial nerve deficit.     Deep Tendon Reflexes: Reflexes are normal and symmetric.     Wt Readings from Last 3 Encounters:  09/01/18 175 lb (79.4 kg)  07/04/18 178 lb (80.7 kg)  07/04/18 178 lb (80.7 kg)    Temp 98.2 F (36.8 C) (Oral)   Ht 5\' 7"  (1.702 m)   Wt 175 lb (79.4 kg)   BMI 27.41 kg/m   Assessment and Plan:  I spent 10 minutes with this patient, More than 50% of that time was spent in voice to voice education, counseling and care coordination.  1. Acute maxillary sinusitis, recurrence not specified Acute.  New onset.  Partially treated.  Patient restarted his Augmentin but only had a 24-hour course remaining feels as if he could continue this he would do well.  Patient has an exam that the patient feels is consistent with sinusitis.  Will refill his Augmentin 875 twice a day for 10 days.   Will call or return to clinic as needed. - amoxicillin-clavulanate (AUGMENTIN) 875-125 MG tablet; Take 1 tablet by mouth 2 (two) times daily.  Dispense: 20 tablet; Refill: 0

## 2019-01-12 DIAGNOSIS — M9903 Segmental and somatic dysfunction of lumbar region: Secondary | ICD-10-CM | POA: Diagnosis not present

## 2019-01-12 DIAGNOSIS — M546 Pain in thoracic spine: Secondary | ICD-10-CM | POA: Diagnosis not present

## 2019-01-12 DIAGNOSIS — M5416 Radiculopathy, lumbar region: Secondary | ICD-10-CM | POA: Diagnosis not present

## 2019-01-12 DIAGNOSIS — M9902 Segmental and somatic dysfunction of thoracic region: Secondary | ICD-10-CM | POA: Diagnosis not present

## 2019-02-03 ENCOUNTER — Other Ambulatory Visit: Payer: Self-pay | Admitting: Family Medicine

## 2019-02-03 ENCOUNTER — Other Ambulatory Visit: Payer: Self-pay

## 2019-02-03 DIAGNOSIS — B349 Viral infection, unspecified: Secondary | ICD-10-CM

## 2019-02-03 DIAGNOSIS — Z8619 Personal history of other infectious and parasitic diseases: Secondary | ICD-10-CM

## 2019-02-03 DIAGNOSIS — I10 Essential (primary) hypertension: Secondary | ICD-10-CM

## 2019-02-03 MED ORDER — LOSARTAN POTASSIUM 100 MG PO TABS: 100.0000 mg | ORAL_TABLET | Freq: Every day | ORAL | 0 refills | Status: DC

## 2019-02-06 ENCOUNTER — Other Ambulatory Visit: Payer: Self-pay

## 2019-02-06 ENCOUNTER — Encounter: Payer: Self-pay | Admitting: Family Medicine

## 2019-02-06 ENCOUNTER — Ambulatory Visit: Payer: BC Managed Care – PPO | Admitting: Family Medicine

## 2019-02-06 VITALS — BP 120/80 | HR 72 | Ht 67.0 in | Wt 174.0 lb

## 2019-02-06 DIAGNOSIS — B349 Viral infection, unspecified: Secondary | ICD-10-CM | POA: Diagnosis not present

## 2019-02-06 DIAGNOSIS — K219 Gastro-esophageal reflux disease without esophagitis: Secondary | ICD-10-CM

## 2019-02-06 DIAGNOSIS — I10 Essential (primary) hypertension: Secondary | ICD-10-CM

## 2019-02-06 DIAGNOSIS — Z8619 Personal history of other infectious and parasitic diseases: Secondary | ICD-10-CM

## 2019-02-06 DIAGNOSIS — E785 Hyperlipidemia, unspecified: Secondary | ICD-10-CM | POA: Diagnosis not present

## 2019-02-06 MED ORDER — LOSARTAN POTASSIUM 100 MG PO TABS: 100.0000 mg | ORAL_TABLET | Freq: Every day | ORAL | 1 refills | Status: DC

## 2019-02-06 MED ORDER — OMEGA-3 1000 MG PO CAPS: 1.0000 | ORAL_CAPSULE | Freq: Every day | ORAL | 3 refills | Status: DC

## 2019-02-06 MED ORDER — ACYCLOVIR 200 MG PO CAPS: 200.0000 mg | ORAL_CAPSULE | Freq: Every day | ORAL | 3 refills | Status: DC

## 2019-02-06 MED ORDER — ESOMEPRAZOLE MAGNESIUM 40 MG PO CPDR: 40.0000 mg | DELAYED_RELEASE_CAPSULE | Freq: Every day | ORAL | 1 refills | Status: DC

## 2019-02-06 NOTE — Addendum Note (Signed)
Addended by: Otilio Miu C on: 02/06/2019 11:43 AM   Modules accepted: Level of Service

## 2019-02-06 NOTE — Progress Notes (Signed)
Date:  02/06/2019   Name:  Matthew Zuniga.   DOB:  [birth year]   MRN:  [id number]   Chief Complaint: Hypertension, Gastroesophageal Reflux, and viral syndrome  Hypertension This is a chronic problem. The current episode started more than 1 year ago. The problem has been gradually improving since onset. The problem is controlled. Pertinent negatives include no anxiety, blurred vision, chest pain, headaches, malaise/fatigue, neck pain, orthopnea, palpitations, peripheral edema, PND, shortness of breath or sweats. There are no associated agents to hypertension. Risk factors for coronary artery disease include dyslipidemia. Past treatments include angiotensin blockers. The current treatment provides moderate improvement. There are no compliance problems.  There is no history of angina, kidney disease, CVA, heart failure, PVD or retinopathy. There is no history of chronic renal disease, a hypertension causing med or renovascular disease.  Gastroesophageal Reflux He reports no abdominal pain, no belching, no chest pain, no choking, no coughing, no dysphagia, no early satiety, no globus sensation, no heartburn, no hoarse voice, no nausea, no sore throat, no stridor, no tooth decay, no water brash or no wheezing. This is a chronic problem. The current episode started more than 1 year ago. The symptoms are aggravated by certain foods. Pertinent negatives include no anemia, fatigue, melena, muscle weakness, orthopnea or weight loss. He has tried a PPI for the symptoms. The treatment provided moderate relief. Past procedures do not include an abdominal ultrasound or esophageal pH monitoring.    Review of Systems  Constitutional: Negative for chills, fatigue, fever, malaise/fatigue and weight loss.  HENT: Negative for drooling, ear discharge, ear pain, hoarse voice and sore throat.   Eyes: Negative for blurred vision.  Respiratory: Negative for cough, choking, shortness of breath and wheezing.    Cardiovascular: Negative for chest pain, palpitations, orthopnea, leg swelling and PND.  Gastrointestinal: Negative for abdominal pain, blood in stool, constipation, diarrhea, dysphagia, heartburn, melena and nausea.  Endocrine: Negative for polydipsia.  Genitourinary: Negative for dysuria, frequency, hematuria and urgency.  Musculoskeletal: Negative for back pain, myalgias, muscle weakness and neck pain.  Skin: Negative for rash.  Allergic/Immunologic: Negative for environmental allergies.  Neurological: Negative for dizziness and headaches.  Hematological: Does not bruise/bleed easily.  Psychiatric/Behavioral: Negative for suicidal ideas. The patient is not nervous/anxious.     Patient Active Problem List   Diagnosis Date Noted  . Hyperlipidemia 09/11/2016  . 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 Alcohol intoxication ([place]) 04/22/2016  . Pneumonia 04/22/2016  . Hypertension 12/02/2015  . GERD (gastroesophageal reflux disease) 12/02/2015  . Viral syndrome 12/02/2015    No Known Allergies  No past surgical history on file.  Social History   Tobacco Use  . Smoking status: Never Smoker  . Smokeless tobacco: Current User    Types: Snuff  Substance Use Topics  . Alcohol use: Yes    Alcohol/week: 0.0 standard drinks  . Drug use: Not on file     Medication list has been reviewed and updated.  Current Meds  Medication Sig  . acyclovir (ZOVIRAX) 200 MG capsule Take 1 capsule by mouth once daily  . esomeprazole (NEXIUM) 40 MG capsule Take 1 capsule (40 mg total) by mouth daily at 12 noon. otc  . losartan (COZAAR) 100 MG tablet Take 1 tablet (100 mg total) by mouth daily.  . Multiple Vitamin (MULTIVITAMIN WITH MINERALS) TABS tablet Take 1 tablet by mouth daily.  . Omega-3 1000 MG CAPS Take 1 capsule (1,000 mg total) by mouth daily.  . [DISCONTINUED] ibuprofen (  ADVIL,MOTRIN) 600 MG tablet Take 1 tablet (600 mg total) by mouth every 8 (eight) hours as needed.    PHQ 2/9 Scores  02/06/2019 02/25/2017 02/25/2017  PHQ - 2 Score 0 0 0  PHQ- 9 Score 0 0 -    BP Readings from Last 3 Encounters:  02/06/19 120/80  07/16/18 120/70  07/04/18 123/90    Physical Exam Vitals signs and nursing note reviewed.  HENT:     Head: Normocephalic.     Right Ear: Tympanic membrane, ear canal and external ear normal.     Left Ear: Tympanic membrane, ear canal and external ear normal.     Nose: Nose normal.  Eyes:     General: No scleral icterus.       Right eye: No discharge.        Left eye: No discharge.     Conjunctiva/sclera: Conjunctivae normal.     Pupils: Pupils are equal, round, and reactive to light.  Neck:     Musculoskeletal: Normal range of motion and neck supple. No neck rigidity or muscular tenderness.     Thyroid: No thyromegaly.     Vascular: No carotid bruit or JVD.     Trachea: No tracheal deviation.  Cardiovascular:     Rate and Rhythm: Normal rate and regular rhythm.     Heart sounds: Normal heart sounds. No murmur. No friction rub. No gallop.   Pulmonary:     Effort: No respiratory distress.     Breath sounds: Normal breath sounds. No wheezing or rales.  Abdominal:     General: Bowel sounds are normal.     Palpations: Abdomen is soft. There is no mass.     Tenderness: There is no abdominal tenderness. There is no guarding or rebound.  Musculoskeletal: Normal range of motion.        General: No tenderness.  Lymphadenopathy:     Cervical: No cervical adenopathy.  Skin:    General: Skin is warm.     Findings: No rash.  Neurological:     Mental Status: He is alert and oriented to person, place, and time.     Cranial Nerves: No cranial nerve deficit.     Deep Tendon Reflexes: Reflexes are normal and symmetric.     Wt Readings from Last 3 Encounters:  02/06/19 174 lb (78.9 kg)  09/01/18 175 lb (79.4 kg)  07/04/18 178 lb (80.7 kg)    BP 120/80   Pulse 72   Ht 5\' 7"  (1.702 m)   Wt 174 lb (78.9 kg)   BMI 27.25 kg/m   Assessment and Plan:  1. Essential hypertension Chronic.  Controlled.  Continue losartan 100 mg once a day.  Will check renal function panel. - losartan (COZAAR) 100 MG tablet; Take 1 tablet (100 mg total) by mouth daily.  Dispense: 90 tablet; Refill: 1 - Renal Function Panel  2. Gastroesophageal reflux disease, esophagitis presence not specified Chronic.  Controlled.  Continue Nexium 40 mg once a day. - esomeprazole (NEXIUM) 40 MG capsule; Take 1 capsule (40 mg total) by mouth daily at 12 noon. otc  Dispense: 90 capsule; Refill: 1  3. 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  Will continue acyclovir 200 mg once a day. - acyclovir (ZOVIRAX) 200 MG capsule; Take 1 capsule (200 mg total) by mouth daily.  Dispense: 90 capsule; Refill: 3  4. Viral syndrome See above. - acyclovir (ZOVIRAX) 200 MG capsule; Take 1 capsule (200 mg total)  by mouth daily.  Dispense: 90 capsule; Refill: 3  5. Hyperlipidemia, unspecified hyperlipidemia type Patient has a history of hyperlipidemia consistent with triglyceride elevation.  Patient currently controlling with omega-3 over-the-counter.  Will continue such we will check lipid panel for status of control. - Omega-3 1000 MG CAPS; Take 1 capsule (1,000 mg total) by mouth daily.  Dispense: 90 capsule; Refill: 3 - Lipid panel

## 2019-02-07 LAB — RENAL FUNCTION PANEL
Albumin: 4.8 g/dL (ref 4.0–5.0)
BUN/Creatinine Ratio: 16 (ref 9–20)
BUN: 14 mg/dL (ref 6–24)
CO2: 25 mmol/L (ref 20–29)
Calcium: 9.4 mg/dL (ref 8.7–10.2)
Chloride: 98 mmol/L (ref 96–106)
Creatinine, Ser: 0.9 mg/dL (ref 0.76–1.27)
GFR calc Af Amer: 119 mL/min/{1.73_m2} (ref >59)
GFR calc non Af Amer: 103 mL/min/{1.73_m2} (ref >59)
Glucose: 90 mg/dL (ref 65–99)
Phosphorus: 3.6 mg/dL (ref 2.8–4.1)
Potassium: 4.3 mmol/L (ref 3.5–5.2)
Sodium: 138 mmol/L (ref 134–144)

## 2019-02-07 LAB — LIPID PANEL
Chol/HDL Ratio: 2.4 ratio (ref 0.0–5.0)
Cholesterol, Total: 256 mg/dL — ABNORMAL HIGH (ref 100–199)
HDL: 105 mg/dL (ref >39)
LDL Chol Calc (NIH): 141 mg/dL — ABNORMAL HIGH (ref 0–99)
Triglycerides: 60 mg/dL (ref 0–149)
VLDL Cholesterol Cal: 10 mg/dL (ref 5–40)

## 2019-03-16 ENCOUNTER — Ambulatory Visit
Admission: EM | Admit: 2019-03-16 | Discharge: 2019-03-16 | Disposition: A | Discharge status: Home / Self Care | Payer: BLUE CROSS/BLUE SHIELD | Attending: Internal Medicine | Admitting: Internal Medicine

## 2019-03-16 ENCOUNTER — Other Ambulatory Visit: Payer: Self-pay

## 2019-03-16 DIAGNOSIS — J029 Acute pharyngitis, unspecified: Secondary | ICD-10-CM | POA: Diagnosis not present

## 2019-03-16 DIAGNOSIS — R05 Cough: Secondary | ICD-10-CM

## 2019-03-16 DIAGNOSIS — R0981 Nasal congestion: Secondary | ICD-10-CM | POA: Diagnosis not present

## 2019-03-16 DIAGNOSIS — J069 Acute upper respiratory infection, unspecified: Secondary | ICD-10-CM

## 2019-03-16 DIAGNOSIS — Z7189 Other specified counseling: Secondary | ICD-10-CM

## 2019-03-16 DIAGNOSIS — R0989 Other specified symptoms and signs involving the circulatory and respiratory systems: Secondary | ICD-10-CM | POA: Diagnosis not present

## 2019-03-16 LAB — RAPID STREP SCREEN (MED CTR MEBANE ONLY): Streptococcus, Group A Screen (Direct): NEGATIVE

## 2019-03-16 NOTE — ED Triage Notes (Signed)
Reports sinus pressure, nasal congestion and drainage, cough, sore throat and subjective fever since Thursday. Pt alert and oriented X4, cooperative, RR even and unlabored, color WNL. Pt in NAD.

## 2019-03-16 NOTE — ED Provider Notes (Signed)
[facility] ____________________________________________  Time seen: Approximately 3:36 PM  I have reviewed the triage vital signs and the nursing notes.   HISTORY  Chief Complaint Cough, Fever, and Nasal Congestion   HPI Matthew Zuniga. is a 48 y.o. male presenting for evaluation of 4 days of runny nose, nasal congestion, sinus pressure, sore throat and some cough.  Reports he felt warm like he may have had a fever up, particularly for the first 2 to 3 days but not today.  States he felt like he has chest congestion and not able to clear it.  Denies chest pain or shortness of breath.  Has continued to eat and drink well.  Has taken occasional NyQuil which helps.  Denies known sick contacts.  States he wanted to get COVID-19 testing to make sure that was not the situation.  Denies other known sick contacts.  Has been remaining active.  Denies other aggravating alleviating factors.  Denies changes in taste or smell, vomiting, diarrhea, rash, chest pain or shortness of breath.  Juline Patch, MD: PCP   Past Medical History:  Diagnosis Date  . GERD (gastroesophageal reflux disease)   . Hyperlipidemia   . Hypertension   . Viral syndrome     Patient Active Problem List   Diagnosis Date Noted  . Hyperlipidemia 09/11/2016  . 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 Alcohol intoxication ([place]) 04/22/2016  . Pneumonia 04/22/2016  . Hypertension 12/02/2015  . GERD (gastroesophageal reflux disease) 12/02/2015  . Viral syndrome 12/02/2015    History reviewed. No pertinent surgical history.   No current facility-administered medications for this encounter.   Current Outpatient Medications:  .  acyclovir (ZOVIRAX) 200 MG capsule, Take 1 capsule (200 mg total) by mouth daily., Disp: 90 capsule, Rfl: 3 .  esomeprazole (NEXIUM) 40 MG capsule, Take 1 capsule (40 mg total) by mouth daily at 12 noon. otc, Disp: 90 capsule, Rfl: 1 .  losartan (COZAAR) 100 MG tablet, Take 1 tablet  (100 mg total) by mouth daily., Disp: 90 tablet, Rfl: 1 .  Multiple Vitamin (MULTIVITAMIN WITH MINERALS) TABS tablet, Take 1 tablet by mouth daily., Disp: , Rfl:  .  Omega-3 1000 MG CAPS, Take 1 capsule (1,000 mg total) by mouth daily., Disp: 90 capsule, Rfl: 3  Allergies Patient has no known allergies.  No family history on file.  Social History Social History   Tobacco Use  . Smoking status: Never Smoker  . Smokeless tobacco: Current User    Types: Snuff  Substance Use Topics  . Alcohol use: Yes    Alcohol/week: 6.0 standard drinks    Types: 6 Cans of beer per week    Comment: daily  . Drug use: Not on file    Review of Systems Constitutional: Positive subjective fevers.  Denies known fevers. ENT: As above.  Cardiovascular: Denies chest pain. Respiratory: Denies shortness of breath. Gastrointestinal: No abdominal pain.  No nausea, no vomiting.  No diarrhea.  Musculoskeletal: Negative for back pain. Skin: Negative for rash.   ____________________________________________   PHYSICAL EXAM:  VITAL SIGNS: ED Triage Vitals  Enc Vitals Group     BP 03/16/19 1443 (!) 139/94     Pulse Rate 03/16/19 1443 83     Resp 03/16/19 1443 18     Temp 03/16/19 1443 98.6 F (37 C)     Temp Source 03/16/19 1443 Oral     SpO2 03/16/19 1443 100 %     Weight 03/16/19 1444 168 lb (76.2  kg)     Height 03/16/19 1444 5\' 6"  (1.676 m)     Head Circumference --      Peak Flow --      Pain Score 03/16/19 1443 0     Pain Loc --      Pain Edu? --      Excl. in [place]? --     Constitutional: Alert and oriented. Well appearing and in no acute distress. Eyes: Conjunctivae are normal.  Head: Atraumatic.  No frontal sinus tenderness palpation.  Mild bilateral maxillary sinus tender to palpation.  No swelling. No erythema.  Ears: no erythema, normal TMs bilaterally.   Nose:Nasal congestion   Mouth/Throat: Mucous membranes are moist. No pharyngeal erythema. No tonsillar swelling or exudate.   Neck: No stridor.  No cervical spine tenderness to palpation. Hematological/Lymphatic/Immunilogical: No cervical lymphadenopathy. Cardiovascular: Normal rate, regular rhythm. Grossly normal heart sounds.  Good peripheral circulation. Respiratory: Normal respiratory effort.  No retractions. No wheezes, rales or rhonchi. Good air movement.  Musculoskeletal: Ambulatory with steady gait.  Neurologic:  Normal speech and language. No gait instability. Skin:  Skin appears warm, dry and intact. No rash noted. Psychiatric: Mood and affect are normal. Speech and behavior are normal. ___________________________________________   LABS (all labs ordered are listed, but only abnormal results are displayed)  Labs Reviewed  RAPID STREP SCREEN ([facility] ONLY)  NOVEL CORONAVIRUS, NAA (HOSP ORDER, SEND-OUT TO REF LAB; TAT 18-24 HRS)  CULTURE, GROUP A STREP [facility])     PROCEDURES Procedures    INITIAL IMPRESSION / ASSESSMENT AND PLAN / ED COURSE  Pertinent labs & imaging results that were available during my care of the patient were reviewed by me and considered in my medical decision making (see chart for details).  Well-appearing patient.  No acute distress.  Suspect viral upper respiratory infection and viral sinusitis.  4 days symptoms.  COVID-19 testing completed and advice given.  Strep negative, will culture.  Encouraged rest, fluids, supportive care, cough medication as needed.  Patient declined need for prescription cough medication.Discussed indication, risks and benefits of medications with patient.  Discussed follow up with Primary care physician this week as needed. Discussed follow up and return parameters including no resolution or any worsening concerns. Patient verbalized understanding and agreed to plan.   ____________________________________________   FINAL CLINICAL IMPRESSION(S) / ED DIAGNOSES  Final diagnoses:  Upper respiratory tract infection, unspecified type  Advice  given about COVID-19 virus infection     ED Discharge Orders    None       Note: This dictation was prepared with Dragon dictation along with smaller phrase technology. Any transcriptional errors that result from this process are unintentional.         Marylene Land, NP 03/16/19 1608

## 2019-03-18 LAB — NOVEL CORONAVIRUS, NAA (HOSP ORDER, SEND-OUT TO REF LAB; TAT 18-24 HRS): SARS-CoV-2, NAA: NOT DETECTED

## 2019-03-19 LAB — CULTURE, GROUP A STREP (THRC)

## 2019-04-21 ENCOUNTER — Other Ambulatory Visit: Payer: Self-pay

## 2019-04-21 ENCOUNTER — Encounter: Payer: Self-pay | Admitting: Family Medicine

## 2019-04-21 ENCOUNTER — Ambulatory Visit: Payer: BC Managed Care – PPO | Admitting: Family Medicine

## 2019-04-21 VITALS — BP 120/70 | HR 76 | Ht 66.0 in | Wt 179.0 lb

## 2019-04-21 DIAGNOSIS — Z302 Encounter for sterilization: Secondary | ICD-10-CM

## 2019-04-21 DIAGNOSIS — N433 Hydrocele, unspecified: Secondary | ICD-10-CM | POA: Diagnosis not present

## 2019-04-21 NOTE — Progress Notes (Signed)
Date:  04/21/2019   Name:  Matthew Zuniga.   DOB:  1970-06-12   MRN:  [id number]   Chief Complaint: ref urology (wants vasectomy)  Patient is a 48 year old male who presents for a preop/vasectomy exam. The patient reports the following problems: none. Health maintenance has been reviewed up to date.  Male GU Problem The patient's pertinent negatives include no genital injury, genital itching, genital lesions, pelvic pain, penile discharge, penile pain, priapism, scrotal swelling or testicular pain. Primary symptoms comment: for vasectomy. Pertinent negatives include no abdominal pain, anorexia, chest pain, chills, constipation, coughing, diarrhea, discolored urine, dysuria, fever, flank pain, frequency, headaches, hematuria, hesitancy, joint pain, joint swelling, nausea, painful intercourse, rash, shortness of breath, sore throat, urgency, urinary retention or vomiting.    Lab Results  Component Value Date   CREATININE 0.90 02/06/2019   BUN 14 02/06/2019   NA 138 02/06/2019   K 4.3 02/06/2019   CL 98 02/06/2019   CO2 25 02/06/2019   Lab Results  Component Value Date   CHOL 256 (H) 02/06/2019   HDL 105 02/06/2019   LDLCALC 141 (H) 02/06/2019   TRIG 60 02/06/2019   CHOLHDL 2.4 02/06/2019   No results found for: TSH No results found for: HGBA1C   Review of Systems  Constitutional: Negative for chills and fever.  HENT: Negative for sore throat.   Respiratory: Negative for cough and shortness of breath.   Cardiovascular: Negative for chest pain.  Gastrointestinal: Negative for abdominal pain, anorexia, constipation, diarrhea, nausea and vomiting.  Genitourinary: Negative for discharge, dysuria, flank pain, frequency, hesitancy, pelvic pain, penile pain, scrotal swelling, testicular pain and urgency.  Musculoskeletal: Negative for joint pain.  Skin: Negative for rash.  Neurological: Negative for headaches.    Patient Active Problem List   Diagnosis Date Noted  .  Hyperlipidemia 09/11/2016  . 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 Alcohol intoxication ([place]) 04/22/2016  . Pneumonia 04/22/2016  . Hypertension 12/02/2015  . GERD (gastroesophageal reflux disease) 12/02/2015  . Viral syndrome 12/02/2015    No Known Allergies  No past surgical history on file.  Social History   Tobacco Use  . Smoking status: Never Smoker  . Smokeless tobacco: Current User    Types: Snuff  Substance Use Topics  . Alcohol use: Yes    Alcohol/week: 6.0 standard drinks    Types: 6 Cans of beer per week    Comment: daily  . Drug use: Not on file     Medication list has been reviewed and updated.  Current Meds  Medication Sig  . acyclovir (ZOVIRAX) 200 MG capsule Take 1 capsule (200 mg total) by mouth daily.  Marland Kitchen esomeprazole (NEXIUM) 40 MG capsule Take 1 capsule (40 mg total) by mouth daily at 12 noon. otc  . losartan (COZAAR) 100 MG tablet Take 1 tablet (100 mg total) by mouth daily.  . Multiple Vitamin (MULTIVITAMIN WITH MINERALS) TABS tablet Take 1 tablet by mouth daily.  . Omega-3 1000 MG CAPS Take 1 capsule (1,000 mg total) by mouth daily.    PHQ 2/9 Scores 04/21/2019 02/06/2019 02/25/2017 02/25/2017  PHQ - 2 Score 0 0 0 0  PHQ- 9 Score 0 0 0 -    BP Readings from Last 3 Encounters:  04/21/19 120/70  03/16/19 (!) 139/94  02/06/19 120/80    Physical Exam Vitals signs and nursing note reviewed.  HENT:     Head: Normocephalic.     Right Ear: Tympanic membrane and  external ear normal.     Left Ear: Tympanic membrane and external ear normal.     Nose: Nose normal.  Eyes:     General: No scleral icterus.       Right eye: No discharge.        Left eye: No discharge.     Conjunctiva/sclera: Conjunctivae normal.     Pupils: Pupils are equal, round, and reactive to light.  Neck:     Musculoskeletal: Normal range of motion and neck supple.     Thyroid: No thyromegaly.     Vascular: No JVD.     Trachea: No tracheal deviation.  Cardiovascular:     Rate and  Rhythm: Normal rate and regular rhythm.     Heart sounds: Normal heart sounds, S1 normal and S2 normal. No murmur. No systolic murmur. No diastolic murmur. No friction rub. No gallop. No S3 sounds.   Pulmonary:     Effort: No respiratory distress.     Breath sounds: Normal breath sounds. No wheezing or rales.  Abdominal:     General: Bowel sounds are normal.     Palpations: Abdomen is soft. There is no mass.     Tenderness: There is no abdominal tenderness. There is no guarding or rebound.  Genitourinary:    Scrotum/Testes:        Right: Mass, tenderness, swelling, testicular hydrocele or varicocele not present. Right testis is descended. Cremasteric reflex is present.         Left: Testicular hydrocele present. Mass, tenderness, swelling or varicocele not present. Left testis is descended. Cremasteric reflex is present.      Epididymis:     Right: Normal.     Left: Normal.  Musculoskeletal: Normal range of motion.        General: No tenderness.  Lymphadenopathy:     Cervical: No cervical adenopathy.  Skin:    General: Skin is warm.     Findings: No rash.  Neurological:     Mental Status: He is alert and oriented to person, place, and time.     Cranial Nerves: No cranial nerve deficit.     Deep Tendon Reflexes: Reflexes are normal and symmetric.     Wt Readings from Last 3 Encounters:  04/21/19 179 lb (81.2 kg)  03/16/19 168 lb (76.2 kg)  02/06/19 174 lb (78.9 kg)    BP 120/70   Pulse 76   Ht 5\' 6"  (1.676 m)   Wt 179 lb (81.2 kg)   BMI 28.89 kg/m   Assessment and Plan: 1. Encounter for vasectomy Patient presents today [facility].  Will refer to urology upon patient's request for evaluation for vasectomy. - Ambulatory referral to Urology  2. Hydrocele of testis On evaluation was noted to be rather significant hydrocele of the left testicle and will also refer for an evaluation and necessary correction. - Ambulatory referral to  Urology

## 2019-05-26 ENCOUNTER — Encounter: Payer: Self-pay | Admitting: Urology

## 2019-05-26 ENCOUNTER — Ambulatory Visit: Payer: BC Managed Care – PPO | Admitting: Urology

## 2019-05-26 ENCOUNTER — Other Ambulatory Visit: Payer: Self-pay

## 2019-05-26 VITALS — BP 154/91 | HR 100 | Ht 66.0 in | Wt 179.0 lb

## 2019-05-26 DIAGNOSIS — Z3009 Encounter for other general counseling and advice on contraception: Secondary | ICD-10-CM

## 2019-05-26 DIAGNOSIS — N432 Other hydrocele: Secondary | ICD-10-CM | POA: Diagnosis not present

## 2019-05-26 NOTE — Progress Notes (Signed)
05/26/19 9:49 AM   Reginold Agent. Aug 13, 1970 [id number]  Referring provider: Juline Patch, MD [address] [place],  [place] [address]  CC: Discuss vasectomy  HPI: I saw Mr. Bigner in urology clinic today in consultation for vasectomy from Dr. Ronnald Ramp.  He is a healthy 49 year old male with 2 children who does not desire any further biologic children.  He reports some chronic left scrotal swelling that is only mild to moderately bothersome to him, but can be uncomfortable when he is working.  This is never been evaluated with a scrotal ultrasound.   PMH: Past Medical History:  Diagnosis Date  . GERD (gastroesophageal reflux disease)   . Hyperlipidemia   . Hypertension   . Viral syndrome     Surgical History: No past surgical history on file.  Allergies: No Known Allergies  Family History: No family history on file.  Social History:  reports that he has never smoked. His smokeless tobacco use includes snuff. He reports current alcohol use of about 6.0 standard drinks of alcohol per week. No history on file for drug.  ROS: Please see flowsheet from today's date for complete review of systems.  Physical Exam: BP (!) 154/91 (BP Location: Left Arm, Patient Position: Sitting, Cuff Size: Normal)   Pulse 100   Ht 5\' 6"  (1.676 m)   Wt 179 lb (81.2 kg)   BMI 28.89 kg/m    Constitutional:  Alert and oriented, No acute distress. Cardiovascular: No clubbing, cyanosis, or edema. Respiratory: Normal respiratory effort, no increased work of breathing. GI: Abdomen is soft, nontender, nondistended, no abdominal masses GU: Uncircumcised phallus with widely patent meatus.  Testicles 20 cc and descended bilaterally.  Vas difficult to palpate bilaterally secondary to thickened cords.  Left moderate sized hydrocele, right 1 to 2 cm epididymal cyst. Lymph: No cervical or inguinal lymphadenopathy. Skin: No rashes, bruises or suspicious lesions. Neurologic: Grossly  intact, no focal deficits, moving all 4 extremities. Psychiatric: Normal mood and affect.  Laboratory Data: Reviewed  Pertinent Imaging: None to review  Assessment & Plan:   In summary, he is a healthy 49 year old male who desires vasectomy for permanent sterilization.  He also has what appears to be a moderate left hydrocele on exam and we discussed treatment options for this.  We discussed vasectomy in clinic versus proceeding to the operating room for left hydrocelectomy and simultaneous bilateral vasectomy.  He would like his hydrocele treated, and we will schedule left hydrocelectomy and bilateral vasectomy in the operating room.  The risks and benefits were discussed at length.  We discussed the risks and benefits of vasectomy at length.  Vasectomy is intended to be a permanent form of contraception, and does not produce immediate sterility.  Following vasectomy another form of contraception is required until vas occlusion is confirmed by a post-vasectomy semen analysis obtained 2-3 months after the procedure.  Even after vas occlusion is confirmed, vasectomy is not 100% reliable in preventing pregnancy, and the failure rate is approximately 05/1998.  Repeat vasectomy is required in less than 1% of patients.  He should refrain from ejaculation for 1 week after vasectomy.  Options for fertility after vasectomy include vasectomy reversal, and sperm retrieval with in vitro fertilization or ICSI.  These options are not always successful and may be expensive.  Finally, there are other permanent and non-permanent alternatives to vasectomy available. There is no risk of erectile dysfunction, and the volume of semen will be similar to prior, as the majority  of the ejaculate is from the prostate and seminal vesicles. The complication rate is approximately 1-2%, and includes bleeding, infection, and development of chronic scrotal pain.  PLAN: Scrotal ultrasound, will call with results Schedule left  hydrocelectomy, bilateral vasectomy in OR  A total of 40 minutes were spent face-to-face with the patient, greater than 50% was spent in patient education, counseling, and coordination of care regarding risks, benefits, and alternative to vasectomy and hydrocelectomy.   Billey Co, [facility] [address] [place], [place] [address] [phone]

## 2019-05-26 NOTE — Patient Instructions (Addendum)
Hydrocelectomy, Adult  A hydrocelectomy is a surgical procedure to remove a collection of fluid (hydrocele) from the scrotum, which is the pouch that holds the testicles. You may need to have this procedure if a hydrocele is causing painful swelling in your scrotum. Tell a health care provider about:  Any allergies you have.  All medicines you are taking, including vitamins, herbs, eye drops, creams, and over-the-counter medicines.  Any problems you or family members have had with anesthetic medicines.  Any blood disorders you have.  Any surgeries you have had.  Any medical conditions you have. What are the risks? Generally, this is a safe procedure. However, problems may occur, including:  Bleeding into the scrotum (scrotal hematoma).  Damage to nearby structures or organs, including to the testicle or the tube that carries sperm out of the testicle (vas deferens).  Infection.  Allergic reactions to medicines. What happens before the procedure? Staying hydrated Follow instructions from your health care provider about hydration, which may include:  Up to 2 hours before the procedure - you may continue to drink clear liquids, such as water, clear fruit juice, black coffee, and plain tea. Eating and drinking restrictions Follow instructions from your health care provider about eating and drinking, which may include:  8 hours before the procedure - stop eating heavy meals or foods, such as meat, fried foods, or fatty foods.  6 hours before the procedure - stop eating light meals or foods, such as toast or cereal.  6 hours before the procedure - stop drinking milk or drinks that contain milk.  2 hours before the procedure - stop drinking clear liquids. Medicines Ask your health care provider about:  Changing or stopping your regular medicines. This is especially important if you are taking diabetes medicines or blood thinners.  Taking medicines such as aspirin and ibuprofen.  These medicines can thin your blood. Do not take these medicines unless your health care provider tells you to take them.  Taking over-the-counter medicines, vitamins, herbs, and supplements. General instructions  Do not use any products that contain nicotine or tobacco for at least 4 weeks before the procedure. These products include cigarettes, e-cigarettes, and chewing tobacco. If you need help quitting, ask your health care provider.  Plan to have someone take you home from the hospital or clinic.  Plan to have a responsible adult care for you for at least 24 hours after you leave the hospital or clinic. This is important.  Ask your health care provider: ? How your surgery site will be marked. ? What steps will be taken to help prevent infection. These may include:  Removing hair at the surgery site.  Washing skin with a germ-killing soap.  Taking antibiotic medicine. What happens during the procedure?  An IV will be inserted into one of your veins.  You will be given one or more of the following: ? A medicine to make you relax (sedative). ? A medicine to make you fall asleep (general anesthetic).  A small incision will be made through the skin of your scrotum.  Your testicle and the hydrocele will be located, and the hydrocele sac will be opened with an incision.  The fluid will be drained from the hydrocele. Part of the hydrocele sac may be removed.  The hydrocele will be closed with stitches that dissolve (absorbable sutures). This prevents fluid from building up again.  If your hydrocele is large, you may have a thin, rubber drain placed to allow fluid to drain  after the procedure.  The incision in your scrotum will be closed with absorbable sutures, skin glue, or adhesives.  A bandage (dressing) will be placed over the incision. The dressing may be held in place with an athletic support strap (scrotal support). The procedure may vary among health care providers and  hospitals. What happens after the procedure?   Your blood pressure, heart rate, breathing rate, and blood oxygen level will be monitored until you leave the hospital or clinic.  You will be given pain medicine as needed.  Your IV will be removed, and your insertion site will be checked for bleeding.  Do not drive for 24 hours if you were given a sedative during your procedure.  You may need to wear a scrotal support. This holds the dressing in place and supports your scrotum. Summary  A hydrocelectomy is a surgical procedure to remove a collection of fluid (hydrocele) from the scrotum, which is the pouch that holds the testicles. You may need to have this procedure if a hydrocele is causing painful swelling in your scrotum.  During the procedure, the hydrocele will be drained and then closed with stitches that dissolve (absorbable sutures). This prevents fluid from building up again.  If your hydrocele is large, you may have a thin, rubber drain placed to allow fluid to drain after the procedure.  You may need to wear a scrotal support after your procedure. This holds the dressing in place and supports your scrotum. This information is not intended to replace advice given to you by your health care provider. Make sure you discuss any questions you have with your health care provider. Document Revised: 09/30/2018 Document Reviewed: 09/30/2018 Elsevier Patient Education  2020 [employer]   Current Urology, 10(1), 1-14. [email or web address]">  Hydrocelectomy, Adult, Care After This sheet gives you information about how to care for yourself after your procedure. Your health care provider may also give you more specific instructions. If you have problems or questions, contact your health care provider. What can I expect after the procedure? After your procedure, it is common to have:  Mild discomfort and swelling in the pouch that holds your testicles  (scrotum).  Bruising of the scrotum. Follow these instructions at home: Medicines  Take over-the-counter and prescription medicines only as told by your health care provider.  Ask your health care provider if the medicine prescribed to you: ? Requires you to avoid driving or using heavy machinery. ? Can cause constipation. You may need to take these actions to prevent or treat constipation:  Drink enough fluid to keep your urine pale yellow.  Take over-the-counter or prescription medicines.  Eat foods that are high in fiber, such as beans, whole grains, and fresh fruits and vegetables.  Limit foods that are high in fat and processed sugars, such as fried or sweet foods. Bathing  Do not take baths, swim, or use a hot tub until your health care provider approves. Ask your health care provider if you may take showers. You may only be allowed to take sponge baths.  If you were told to wear an athletic support strap (scrotal support), keep it dry. Take it off when you shower or bathe. Incision care   Follow instructions from your health care provider about how to take care of your incision. Make sure you: ? Wash your hands with soap and water before and after you change your bandage (dressing). If soap and water are not available, use hand sanitizer. ? Change your dressing as   told by your health care provider. ? Leave stitches (sutures), skin glue, or adhesive strips in place. These skin closures may need to stay in place for 2 weeks or longer. If adhesive strip edges start to loosen and curl up, you may trim the loose edges. Do not remove adhesive strips completely unless your health care provider tells you to do that.  Check your incision and scrotum every day for signs of infection. Check for: ? More redness, swelling, or pain. ? Fluid or blood. ? Warmth. ? Pus or a bad smell. Managing pain and swelling If directed, put ice on the affected area. To do this:  Put ice in a plastic  bag.  Place a towel between your skin and the bag.  Leave the ice on for 20 minutes, 2-3 times per day.  Activity  Do not do any high-energy activities for as long as told by your health care provider.  Do not lift anything that is heavier than 10 lb (4.5 kg), or the limit that you are told, until your health care provider says that it is safe.  Return to your normal activities as told by your health care provider. Ask your health care provider what activities are safe for you.  Do not drive for 24 hours if you were given a sedative during your procedure.  Ask your health care provider when it is safe to drive. General instructions  Do not use any products that contain nicotine or tobacco, such as cigarettes, e-cigarettes, and chewing tobacco. These can delay incision healing after surgery. If you need help quitting, ask your health care provider.  If you were given a scrotal support, wear it as told by your health care provider.  If you had a drain put in during the procedure, you will need to have it removed at a follow-up visit.  Keep all follow-up visits as told by your health care provider. This is important. Contact a health care provider if:  Your pain is not controlled with medicine.  You have more redness, swelling, or pain around your scrotum.  You have fluid or blood coming from your incision.  Your incision feels warm to the touch.  You have pus or a bad smell coming from your scrotum.  You have a fever. Get help right away if:  You develop shaking, chills, and a fever that is higher than 101.8F (38.8C).  You have redness or swelling that starts at your scrotum and spreads outward to cover your whole groin.  You develop swelling of the legs or difficulty breathing. Summary  After a hydrocelectomy, it is common to have mild discomfort, swelling, and bruising.  Do not take baths, swim, or use a hot tub until your health care provider approves. Ask your  health care provider if you may take showers.  If directed, put ice on the affected area to help with pain and swelling.  Do not do any high-energy activities or lift anything heavier than 10 lb (4.5 kg) for as long as told by your health care provider.  If you were given a scrotal support, keep it dry. Wear the scrotal support as told by your health care provider. This information is not intended to replace advice given to you by your health care provider. Make sure you discuss any questions you have with your health care provider. Document Revised: 09/30/2018 Document Reviewed: 09/30/2018 Elsevier Patient Education  2020 [employer] Inc[employer]  

## 2019-06-01 DIAGNOSIS — M9902 Segmental and somatic dysfunction of thoracic region: Secondary | ICD-10-CM | POA: Diagnosis not present

## 2019-06-01 DIAGNOSIS — M5416 Radiculopathy, lumbar region: Secondary | ICD-10-CM | POA: Diagnosis not present

## 2019-06-01 DIAGNOSIS — M9903 Segmental and somatic dysfunction of lumbar region: Secondary | ICD-10-CM | POA: Diagnosis not present

## 2019-06-01 DIAGNOSIS — M546 Pain in thoracic spine: Secondary | ICD-10-CM | POA: Diagnosis not present

## 2019-06-05 DIAGNOSIS — M9903 Segmental and somatic dysfunction of lumbar region: Secondary | ICD-10-CM | POA: Diagnosis not present

## 2019-06-05 DIAGNOSIS — M5416 Radiculopathy, lumbar region: Secondary | ICD-10-CM | POA: Diagnosis not present

## 2019-06-05 DIAGNOSIS — M9902 Segmental and somatic dysfunction of thoracic region: Secondary | ICD-10-CM | POA: Diagnosis not present

## 2019-06-05 DIAGNOSIS — M546 Pain in thoracic spine: Secondary | ICD-10-CM | POA: Diagnosis not present

## 2019-06-26 DIAGNOSIS — M5416 Radiculopathy, lumbar region: Secondary | ICD-10-CM | POA: Diagnosis not present

## 2019-06-26 DIAGNOSIS — M9902 Segmental and somatic dysfunction of thoracic region: Secondary | ICD-10-CM | POA: Diagnosis not present

## 2019-06-26 DIAGNOSIS — M9903 Segmental and somatic dysfunction of lumbar region: Secondary | ICD-10-CM | POA: Diagnosis not present

## 2019-06-26 DIAGNOSIS — M546 Pain in thoracic spine: Secondary | ICD-10-CM | POA: Diagnosis not present

## 2019-07-06 DIAGNOSIS — M9902 Segmental and somatic dysfunction of thoracic region: Secondary | ICD-10-CM | POA: Diagnosis not present

## 2019-07-06 DIAGNOSIS — M546 Pain in thoracic spine: Secondary | ICD-10-CM | POA: Diagnosis not present

## 2019-07-06 DIAGNOSIS — M9903 Segmental and somatic dysfunction of lumbar region: Secondary | ICD-10-CM | POA: Diagnosis not present

## 2019-07-06 DIAGNOSIS — M5416 Radiculopathy, lumbar region: Secondary | ICD-10-CM | POA: Diagnosis not present

## 2019-09-01 ENCOUNTER — Other Ambulatory Visit: Payer: Self-pay | Admitting: Family Medicine

## 2019-09-01 DIAGNOSIS — I10 Essential (primary) hypertension: Secondary | ICD-10-CM

## 2019-09-03 ENCOUNTER — Encounter: Payer: Self-pay | Admitting: Family Medicine

## 2019-09-03 ENCOUNTER — Ambulatory Visit (INDEPENDENT_AMBULATORY_CARE_PROVIDER_SITE_OTHER): Payer: BC Managed Care – PPO | Admitting: Family Medicine

## 2019-09-03 ENCOUNTER — Other Ambulatory Visit: Payer: Self-pay

## 2019-09-03 DIAGNOSIS — B349 Viral infection, unspecified: Secondary | ICD-10-CM | POA: Diagnosis not present

## 2019-09-03 DIAGNOSIS — K219 Gastro-esophageal reflux disease without esophagitis: Secondary | ICD-10-CM

## 2019-09-03 DIAGNOSIS — Z8619 Personal history of other infectious and parasitic diseases: Secondary | ICD-10-CM

## 2019-09-03 DIAGNOSIS — I10 Essential (primary) hypertension: Secondary | ICD-10-CM

## 2019-09-03 DIAGNOSIS — E785 Hyperlipidemia, unspecified: Secondary | ICD-10-CM

## 2019-09-03 MED ORDER — ESOMEPRAZOLE MAGNESIUM 40 MG PO CPDR: 40.0000 mg | DELAYED_RELEASE_CAPSULE | Freq: Every day | ORAL | 1 refills | Status: DC

## 2019-09-03 MED ORDER — LOSARTAN POTASSIUM 100 MG PO TABS: 100.0000 mg | ORAL_TABLET | Freq: Every day | ORAL | 1 refills | Status: DC

## 2019-09-03 MED ORDER — ACYCLOVIR 200 MG PO CAPS: 200.0000 mg | ORAL_CAPSULE | Freq: Every day | ORAL | 3 refills | Status: DC

## 2019-09-03 MED ORDER — OMEGA-3 1000 MG PO CAPS: 1.0000 | ORAL_CAPSULE | Freq: Every day | ORAL | 3 refills | Status: AC

## 2019-09-03 NOTE — Progress Notes (Signed)
Date:  09/03/2019   Name:  Matthew Zuniga.   DOB:  10-29-1970   MRN:  [id number]   Chief Complaint: Gastroesophageal Reflux, Hypertension, and Mouth Lesions (takes acyclovir for this)  Gastroesophageal Reflux He reports no abdominal pain, no belching, no chest pain, no choking, no coughing, no dysphagia, no early satiety, no globus sensation, no heartburn, no hoarse voice, no nausea, no sore throat, no stridor, no tooth decay, no water brash or no wheezing. This is a recurrent problem. The current episode started in the past 7 days. The problem occurs frequently. The problem has been gradually improving. The symptoms are aggravated by certain foods. Pertinent negatives include no anemia, fatigue, melena, muscle weakness, orthopnea or weight loss. There are no known risk factors. He has tried a PPI for the symptoms. The treatment provided moderate relief. Past procedures do not include an abdominal ultrasound or esophageal pH monitoring.  Hypertension This is a chronic problem. The current episode started more than 1 year ago. The problem has been waxing and waning since onset. Pertinent negatives include no anxiety, blurred vision, chest pain, headaches, malaise/fatigue, neck pain, orthopnea, palpitations, peripheral edema, PND, shortness of breath or sweats. There are no associated agents to hypertension. There are no known risk factors for coronary artery disease. Past treatments include angiotensin blockers. The current treatment provides moderate improvement. There are no compliance problems.  There is no history of angina, kidney disease, CAD/MI, CVA, heart failure, left ventricular hypertrophy, PVD or retinopathy. There is no history of chronic renal disease, a hypertension causing med or renovascular disease.  Mouth Lesions  The onset was sudden. The problem occurs occasionally. The problem has been gradually improving. The problem is mild. The symptoms are relieved by one or more  prescription drugs. Associated symptoms include mouth sores. Pertinent negatives include no orthopnea, no fever, no abdominal pain, no constipation, no diarrhea, no nausea, no ear discharge, no ear pain, no headaches, no sore throat, no neck pain, no cough, no wheezing and no rash.  Hyperlipidemia This is a chronic problem. The current episode started more than 1 year ago. The problem is controlled. Recent lipid tests were reviewed and are normal. He has no history of chronic renal disease, diabetes, hypothyroidism, liver disease, obesity or nephrotic syndrome. There are no known factors aggravating his hyperlipidemia. Pertinent negatives include no chest pain, focal sensory loss, focal weakness, leg pain, myalgias or shortness of breath. Treatments tried: omega 3. The current treatment provides moderate improvement of lipids. There are no compliance problems.  Risk factors for coronary artery disease include hypertension.    Lab Results  Component Value Date   CREATININE 0.90 02/06/2019   BUN 14 02/06/2019   NA 138 02/06/2019   K 4.3 02/06/2019   CL 98 02/06/2019   CO2 25 02/06/2019   Lab Results  Component Value Date   CHOL 256 (H) 02/06/2019   HDL 105 02/06/2019   LDLCALC 141 (H) 02/06/2019   TRIG 60 02/06/2019   CHOLHDL 2.4 02/06/2019   No results found for: TSH No results found for: HGBA1C Lab Results  Component Value Date   WBC 5.9 11/01/2014   HGB 14.3 11/01/2014   HCT 41.1 11/01/2014   MCV 98 (H) 11/01/2014   PLT 242 11/01/2014   Lab Results  Component Value Date   ALT 33 11/01/2014   AST 30 11/01/2014   ALKPHOS 67 11/01/2014   BILITOT 0.4 11/01/2014     Review of Systems  Constitutional: Negative  for chills, fatigue, fever, malaise/fatigue and weight loss.  HENT: Positive for mouth sores. Negative for drooling, ear discharge, ear pain, hoarse voice, postnasal drip and sore throat.   Eyes: Negative for blurred vision.  Respiratory: Negative for cough, choking,  shortness of breath and wheezing.   Cardiovascular: Negative for chest pain, palpitations, orthopnea, leg swelling and PND.  Gastrointestinal: Negative for abdominal pain, blood in stool, constipation, diarrhea, dysphagia, heartburn, melena and nausea.  Endocrine: Negative for polydipsia.  Genitourinary: Negative for dysuria, frequency, hematuria and urgency.  Musculoskeletal: Negative for back pain, myalgias, muscle weakness and neck pain.  Skin: Negative for rash.  Allergic/Immunologic: Negative for environmental allergies.  Neurological: Negative for dizziness, focal weakness and headaches.  Hematological: Does not bruise/bleed easily.  Psychiatric/Behavioral: Negative for suicidal ideas. The patient is not nervous/anxious.     Patient Active Problem List   Diagnosis Date Noted  . Hyperlipidemia 09/11/2016  . 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 Alcohol intoxication ([place]) 04/22/2016  . Pneumonia 04/22/2016  . Hypertension 12/02/2015  . GERD (gastroesophageal reflux disease) 12/02/2015  . Viral syndrome 12/02/2015    No Known Allergies  No past surgical history on file.  Social History   Tobacco Use  . Smoking status: Never Smoker  . Smokeless tobacco: Current User    Types: Snuff  Substance Use Topics  . Alcohol use: Yes    Alcohol/week: 6.0 standard drinks    Types: 6 Cans of beer per week    Comment: daily  . Drug use: Never     Medication list has been reviewed and updated.  Current Meds  Medication Sig  . acyclovir (ZOVIRAX) 200 MG capsule Take 1 capsule (200 mg total) by mouth daily.  Marland Kitchen esomeprazole (NEXIUM) 40 MG capsule Take 1 capsule (40 mg total) by mouth daily at 12 noon. otc  . losartan (COZAAR) 100 MG tablet Take 1 tablet (100 mg total) by mouth daily.  . Multiple Vitamin (MULTIVITAMIN WITH MINERALS) TABS tablet Take 1 tablet by mouth daily.  . Omega-3 1000 MG CAPS Take 1 capsule (1,000 mg total) by mouth daily.    PHQ 2/9 Scores 09/03/2019 04/21/2019  02/06/2019 02/25/2017  PHQ - 2 Score 0 0 0 0  PHQ- 9 Score 0 0 0 0    BP Readings from Last 3 Encounters:  09/03/19 112/82  05/26/19 (!) 154/91  04/21/19 120/70    Physical Exam Vitals and nursing note reviewed.  HENT:     Head: Normocephalic.     Right Ear: Tympanic membrane, ear canal and external ear normal.     Left Ear: Tympanic membrane, ear canal and external ear normal.     Nose: Nose normal. No congestion or rhinorrhea.     Mouth/Throat:     Mouth: Mucous membranes are moist.  Eyes:     General: No scleral icterus.       Right eye: No discharge.        Left eye: No discharge.     Conjunctiva/sclera: Conjunctivae normal.     Pupils: Pupils are equal, round, and reactive to light.  Neck:     Thyroid: No thyromegaly.     Vascular: No JVD.     Trachea: No tracheal deviation.  Cardiovascular:     Rate and Rhythm: Normal rate and regular rhythm.     Heart sounds: Normal heart sounds. No murmur. No friction rub. No gallop.   Pulmonary:     Effort: Pulmonary effort is normal. No respiratory distress.  Breath sounds: Normal breath sounds. No wheezing, rhonchi or rales.  Abdominal:     General: Bowel sounds are normal.     Palpations: Abdomen is soft. There is no mass.     Tenderness: There is no abdominal tenderness. There is no guarding or rebound.  Musculoskeletal:        General: No tenderness. Normal range of motion.     Cervical back: Normal range of motion and neck supple.  Lymphadenopathy:     Cervical: No cervical adenopathy.  Skin:    General: Skin is warm.     Findings: No rash.  Neurological:     Mental Status: He is alert and oriented to person, place, and time.     Cranial Nerves: No cranial nerve deficit.     Deep Tendon Reflexes: Reflexes are normal and symmetric.     Wt Readings from Last 3 Encounters:  09/03/19 171 lb (77.6 kg)  05/26/19 179 lb (81.2 kg)  04/21/19 179 lb (81.2 kg)    BP 112/82   Pulse 88   Ht 5\' 7"  (1.702 m)   Wt 171  lb (77.6 kg)   BMI 26.78 kg/m   Assessment and Plan: 1. Essential hypertension Chronic.  Controlled.  Stable.  Continue losartan 100 mg once a day.  Review of last lab work in September and we will defer to next visit in September for lab work. - losartan (COZAAR) 100 MG tablet; Take 1 tablet (100 mg total) by mouth daily.  Dispense: 90 tablet; Refill: 1  2. Gastroesophageal reflux disease Chronic.  Controlled.  Stable.  Continue Nexium 40 mg once a day. - esomeprazole (NEXIUM) 40 MG capsule; Take 1 capsule (40 mg total) by mouth daily at 12 noon. otc  Dispense: 90 capsule; Refill: 1  3. H/O cold sores Chronic.  Episodic.  Stable.  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 - acyclovir (ZOVIRAX) 200 MG capsule; Take 1 capsule (200 mg total) by mouth daily.  Dispense: 90 capsule; Refill: 3  4. Viral syndrome Controlled.  Stable.  Continue acyclovir 200 mg once a day. - acyclovir (ZOVIRAX) 200 MG capsule; Take 1 capsule (200 mg total) by mouth daily.  Dispense: 90 capsule; Refill: 3  5. Hyperlipidemia, unspecified hyperlipidemia type Chronic.  Controlled.  Stable.  Continue omega-3 1 g daily. - Omega-3 1000 MG CAPS; Take 1 capsule (1,000 mg total) by mouth daily.  Dispense: 90 capsule; Refill: 3

## 2020-02-19 ENCOUNTER — Ambulatory Visit (INDEPENDENT_AMBULATORY_CARE_PROVIDER_SITE_OTHER): Payer: BC Managed Care – PPO | Admitting: Family Medicine

## 2020-02-19 ENCOUNTER — Other Ambulatory Visit: Payer: Self-pay

## 2020-02-19 ENCOUNTER — Encounter: Payer: Self-pay | Admitting: Family Medicine

## 2020-02-19 VITALS — BP 120/80 | HR 100 | Ht 67.0 in | Wt 175.0 lb

## 2020-02-19 DIAGNOSIS — J0100 Acute maxillary sinusitis, unspecified: Secondary | ICD-10-CM

## 2020-02-19 MED ORDER — AZITHROMYCIN 250 MG PO TABS: ORAL_TABLET | ORAL | 0 refills | Status: DC

## 2020-02-19 NOTE — Progress Notes (Signed)
Date:  02/19/2020   Name:  Matthew Zuniga.   DOB:  Nov 27, 1970   MRN:  [id number]   Chief Complaint: Sinusitis (drainage in throat- started yesterday/ colored drainage)  Sinusitis This is a new problem. The current episode started in the past 7 days. The problem has been gradually worsening since onset. The pain is mild. Associated symptoms include congestion, sinus pressure and a sore throat. Pertinent negatives include no chills, coughing, diaphoresis, ear pain, headaches, hoarse voice, neck pain or shortness of breath. Past treatments include oral decongestants. The treatment provided mild relief.    Lab Results  Component Value Date   CREATININE 0.90 02/06/2019   BUN 14 02/06/2019   NA 138 02/06/2019   K 4.3 02/06/2019   CL 98 02/06/2019   CO2 25 02/06/2019   Lab Results  Component Value Date   CHOL 256 (H) 02/06/2019   HDL 105 02/06/2019   LDLCALC 141 (H) 02/06/2019   TRIG 60 02/06/2019   CHOLHDL 2.4 02/06/2019   No results found for: TSH No results found for: HGBA1C Lab Results  Component Value Date   WBC 5.9 11/01/2014   HGB 14.3 11/01/2014   HCT 41.1 11/01/2014   MCV 98 (H) 11/01/2014   PLT 242 11/01/2014   Lab Results  Component Value Date   ALT 33 11/01/2014   AST 30 11/01/2014   ALKPHOS 67 11/01/2014   BILITOT 0.4 11/01/2014     Review of Systems  Constitutional: Negative for chills, diaphoresis and fever.  HENT: Positive for congestion, sinus pressure and sore throat. Negative for drooling, ear discharge, ear pain and hoarse voice.   Respiratory: Negative for cough, shortness of breath and wheezing.   Cardiovascular: Negative for chest pain, palpitations and leg swelling.  Gastrointestinal: Negative for abdominal pain, blood in stool, constipation, diarrhea and nausea.  Endocrine: Negative for polydipsia.  Genitourinary: Negative for dysuria, frequency, hematuria and urgency.  Musculoskeletal: Negative for back pain, myalgias and neck pain.    Skin: Negative for rash.  Allergic/Immunologic: Negative for environmental allergies.  Neurological: Negative for dizziness and headaches.  Hematological: Does not bruise/bleed easily.  Psychiatric/Behavioral: Negative for suicidal ideas. The patient is not nervous/anxious.     Patient Active Problem List   Diagnosis Date Noted  . Hyperlipidemia 09/11/2016  . 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 Alcohol intoxication ([place]) 04/22/2016  . Pneumonia 04/22/2016  . Hypertension 12/02/2015  . GERD (gastroesophageal reflux disease) 12/02/2015  . Viral syndrome 12/02/2015    No Known Allergies  No past surgical history on file.  Social History   Tobacco Use  . Smoking status: Never Smoker  . Smokeless tobacco: Current User    Types: Snuff  Substance Use Topics  . Alcohol use: Yes    Alcohol/week: 6.0 standard drinks    Types: 6 Cans of beer per week    Comment: daily  . Drug use: Never     Medication list has been reviewed and updated.  Current Meds  Medication Sig  . acyclovir (ZOVIRAX) 200 MG capsule Take 1 capsule (200 mg total) by mouth daily.  Marland Kitchen esomeprazole (NEXIUM) 40 MG capsule Take 1 capsule (40 mg total) by mouth daily at 12 noon. otc  . losartan (COZAAR) 100 MG tablet Take 1 tablet (100 mg total) by mouth daily.  . Multiple Vitamin (MULTIVITAMIN WITH MINERALS) TABS tablet Take 1 tablet by mouth daily.  . Omega-3 1000 MG CAPS Take 1 capsule (1,000 mg total) by mouth daily.  PHQ 2/9 Scores 09/03/2019 04/21/2019 02/06/2019 02/25/2017  PHQ - 2 Score 0 0 0 0  PHQ- 9 Score 0 0 0 0    GAD 7 : Generalized Anxiety Score 09/03/2019 04/21/2019  Nervous, Anxious, on Edge 0 0  Control/stop worrying 0 0  Worry too much - different things 0 0  Trouble relaxing 0 0  Restless 0 0  Easily annoyed or irritable 0 0  Afraid - awful might happen 0 0  Total GAD 7 Score 0 0    BP Readings from Last 3 Encounters:  02/19/20 120/80  09/03/19 112/82  05/26/19 (!) 154/91    Physical  Exam Vitals and nursing note reviewed.  HENT:     Head: Normocephalic.     Jaw: There is normal jaw occlusion.     Right Ear: Tympanic membrane, ear canal and external ear normal. There is no impacted cerumen.     Left Ear: Tympanic membrane, ear canal and external ear normal. There is no impacted cerumen.     Nose: No congestion or rhinorrhea.     Right Sinus: Maxillary sinus tenderness present. No frontal sinus tenderness.     Left Sinus: Maxillary sinus tenderness present. No frontal sinus tenderness.     Mouth/Throat:     Mouth: Mucous membranes are moist.  Eyes:     General: No scleral icterus.       Right eye: No discharge.        Left eye: No discharge.     Conjunctiva/sclera: Conjunctivae normal.     Pupils: Pupils are equal, round, and reactive to light.  Neck:     Thyroid: No thyromegaly.     Vascular: No JVD.     Trachea: No tracheal deviation.  Cardiovascular:     Rate and Rhythm: Normal rate and regular rhythm.     Heart sounds: Normal heart sounds. No murmur heard.  No friction rub. No gallop.   Pulmonary:     Effort: No respiratory distress.     Breath sounds: Normal breath sounds. No wheezing, rhonchi or rales.  Abdominal:     General: Bowel sounds are normal.     Palpations: Abdomen is soft. There is no mass.     Tenderness: There is no abdominal tenderness. There is no guarding or rebound.  Musculoskeletal:        General: No tenderness or deformity. Normal range of motion.     Cervical back: Normal range of motion and neck supple.  Lymphadenopathy:     Cervical: No cervical adenopathy.  Skin:    General: Skin is warm.     Capillary Refill: Capillary refill takes less than 2 seconds.     Findings: No rash.  Neurological:     Mental Status: He is alert and oriented to person, place, and time.     Cranial Nerves: No cranial nerve deficit.     Deep Tendon Reflexes: Reflexes are normal and symmetric.     Wt Readings from Last 3 Encounters:  02/19/20 175  lb (79.4 kg)  09/03/19 171 lb (77.6 kg)  05/26/19 179 lb (81.2 kg)    BP 120/80   Pulse 100   Ht 5\' 7"  (1.702 m)   Wt 175 lb (79.4 kg)   BMI 27.41 kg/m   Assessment and Plan: 1. Acute non-recurrent maxillary sinusitis Acute.  Persistent.  Stable.  Exam and history is consistent with an acute maxillary sinusitis for which we will initiate a azithromycin to 50 mg to 2  days followed by 1 a day for 4 days. - azithromycin (ZITHROMAX) 250 MG tablet; 2 today then 1 a day for 4 days  Dispense: 6 tablet; Refill: 0

## 2020-02-25 ENCOUNTER — Other Ambulatory Visit: Payer: Self-pay | Admitting: Family Medicine

## 2020-02-25 DIAGNOSIS — I10 Essential (primary) hypertension: Secondary | ICD-10-CM

## 2020-04-26 ENCOUNTER — Telehealth: Payer: Self-pay

## 2020-04-26 NOTE — Telephone Encounter (Unsigned)
Copied from [place] [id number]. Topic: General - Other >> Apr 26, 2020  8:27 AM Oneta Rack wrote: Reason for CRM: patient did not want to elaborate requesting to speak with Baxter Flattery

## 2020-04-26 NOTE — Telephone Encounter (Signed)
Gave pt the covid number- he is interested in antibodies

## 2020-04-30 ENCOUNTER — Telehealth (HOSPITAL_COMMUNITY): Payer: Self-pay

## 2020-04-30 NOTE — Telephone Encounter (Signed)
Called to Discuss with patient about Covid symptoms and the use of the monoclonal antibody infusion for those with mild to moderate Covid symptoms and at a high risk of hospitalization.     Pt appears to qualify for this infusion due to co-morbid conditions and/or a member of an at-risk group in accordance with the FDA Emergency Use Authorization.    Pt stated that his symptoms started on Friday 12/3 with fever, congestion, body aches and the "worse headache of my life". Pt stated he was finally feeling better and is out deer hunting. Pt declined treatment at this time. RN educated about CDC guidance of quarantine time and re-testing. Provided hotline number for his daughter who has recently tested positive.

## 2020-05-27 ENCOUNTER — Encounter: Payer: Self-pay | Admitting: Family Medicine

## 2020-05-27 ENCOUNTER — Telehealth: Payer: Self-pay

## 2020-05-27 ENCOUNTER — Ambulatory Visit
Admission: RE | Admit: 2020-05-27 | Discharge: 2020-05-27 | Disposition: A | Discharge status: Home / Self Care | Payer: No Typology Code available for payment source | Attending: Family Medicine | Admitting: Family Medicine

## 2020-05-27 ENCOUNTER — Other Ambulatory Visit: Payer: Self-pay

## 2020-05-27 ENCOUNTER — Ambulatory Visit
Admission: RE | Admit: 2020-05-27 | Discharge: 2020-05-27 | Disposition: A | Discharge status: Home / Self Care | Payer: No Typology Code available for payment source | Source: Ambulatory Visit | Attending: Family Medicine | Admitting: Family Medicine

## 2020-05-27 ENCOUNTER — Ambulatory Visit: Payer: No Typology Code available for payment source | Admitting: Family Medicine

## 2020-05-27 VITALS — BP 120/62 | HR 104 | Temp 98.5°F | Ht 67.0 in | Wt 177.0 lb

## 2020-05-27 DIAGNOSIS — U099 Post covid-19 condition, unspecified: Secondary | ICD-10-CM | POA: Insufficient documentation

## 2020-05-27 DIAGNOSIS — R053 Chronic cough: Secondary | ICD-10-CM

## 2020-05-27 NOTE — Telephone Encounter (Unsigned)
Copied from [place] [id number]. Topic: General - Other >> May 27, 2020  9:17 AM Celene Kras wrote: Reason for CRM: Pt called and is requesting to have Matthew Zuniga give him a call back regarding the congestion in his chest. Please advise.

## 2020-05-27 NOTE — Telephone Encounter (Signed)
Coming in at 11

## 2020-05-27 NOTE — Progress Notes (Signed)
Date:  05/27/2020   Name:  Matthew Zuniga.   DOB:  05-Mar-1971   MRN:  [id number]   Chief Complaint: lung issue (Dx on 04/26/20 with Covid- no fever, just can't seem to get better with lungs)  Cough This is a new problem. The current episode started more than 1 month ago. The problem has been unchanged. The cough is non-productive. Associated symptoms include shortness of breath. Pertinent negatives include no chest pain, chills, ear congestion, ear pain, fever, headaches, heartburn, hemoptysis, myalgias, nasal congestion, postnasal drip, rash, rhinorrhea, sore throat, sweats, weight loss or wheezing. There is no history of environmental allergies.    Lab Results  Component Value Date   CREATININE 0.90 02/06/2019   BUN 14 02/06/2019   NA 138 02/06/2019   K 4.3 02/06/2019   CL 98 02/06/2019   CO2 25 02/06/2019   Lab Results  Component Value Date   CHOL 256 (H) 02/06/2019   HDL 105 02/06/2019   LDLCALC 141 (H) 02/06/2019   TRIG 60 02/06/2019   CHOLHDL 2.4 02/06/2019   No results found for: TSH No results found for: HGBA1C Lab Results  Component Value Date   WBC 5.9 11/01/2014   HGB 14.3 11/01/2014   HCT 41.1 11/01/2014   MCV 98 (H) 11/01/2014   PLT 242 11/01/2014   Lab Results  Component Value Date   ALT 33 11/01/2014   AST 30 11/01/2014   ALKPHOS 67 11/01/2014   BILITOT 0.4 11/01/2014     Review of Systems  Constitutional: Negative for chills, fever and weight loss.  HENT: Negative for drooling, ear discharge, ear pain, postnasal drip, rhinorrhea and sore throat.   Respiratory: Positive for cough and shortness of breath. Negative for hemoptysis and wheezing.   Cardiovascular: Negative for chest pain, palpitations and leg swelling.  Gastrointestinal: Negative for abdominal pain, blood in stool, constipation, diarrhea, heartburn and nausea.  Endocrine: Negative for polydipsia.  Genitourinary: Negative for dysuria, frequency, hematuria and urgency.   Musculoskeletal: Negative for back pain, myalgias and neck pain.  Skin: Negative for rash.  Allergic/Immunologic: Negative for environmental allergies.  Neurological: Negative for dizziness and headaches.  Hematological: Does not bruise/bleed easily.  Psychiatric/Behavioral: Negative for suicidal ideas. The patient is not nervous/anxious.     Patient Active Problem List   Diagnosis Date Noted  . Hyperlipidemia 09/11/2016  . 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 Alcohol intoxication ([place]) 04/22/2016  . Pneumonia 04/22/2016  . Hypertension 12/02/2015  . GERD (gastroesophageal reflux disease) 12/02/2015  . Viral syndrome 12/02/2015    No Known Allergies  No past surgical history on file.  Social History   Tobacco Use  . Smoking status: Never Smoker  . Smokeless tobacco: Current User    Types: Snuff  Substance Use Topics  . Alcohol use: Yes    Alcohol/week: 6.0 standard drinks    Types: 6 Cans of beer per week    Comment: daily  . Drug use: Never     Medication list has been reviewed and updated.  Current Meds  Medication Sig  . acyclovir (ZOVIRAX) 200 MG capsule Take 1 capsule (200 mg total) by mouth daily.  Marland Kitchen esomeprazole (NEXIUM) 40 MG capsule Take 1 capsule (40 mg total) by mouth daily at 12 noon. otc  . losartan (COZAAR) 100 MG tablet Take 1 tablet by mouth once daily  . Multiple Vitamin (MULTIVITAMIN WITH MINERALS) TABS tablet Take 1 tablet by mouth daily.  . Omega-3 1000 MG CAPS Take 1  capsule (1,000 mg total) by mouth daily.    PHQ 2/9 Scores 09/03/2019 04/21/2019 02/06/2019 02/25/2017  PHQ - 2 Score 0 0 0 0  PHQ- 9 Score 0 0 0 0    GAD 7 : Generalized Anxiety Score 09/03/2019 04/21/2019  Nervous, Anxious, on Edge 0 0  Control/stop worrying 0 0  Worry too much - different things 0 0  Trouble relaxing 0 0  Restless 0 0  Easily annoyed or irritable 0 0  Afraid - awful might happen 0 0  Total GAD 7 Score 0 0    BP Readings from Last 3 Encounters:  05/27/20 120/62   02/19/20 120/80  09/03/19 112/82    Physical Exam Vitals and nursing note reviewed.  HENT:     Head: Normocephalic.     Right Ear: Tympanic membrane, ear canal and external ear normal. There is no impacted cerumen.     Left Ear: Tympanic membrane, ear canal and external ear normal.     Nose: Nose normal. No congestion or rhinorrhea.     Mouth/Throat:     Mouth: Oropharynx is clear and moist.  Eyes:     General: No scleral icterus.       Right eye: No discharge.        Left eye: No discharge.     Extraocular Movements: EOM normal.     Conjunctiva/sclera: Conjunctivae normal.     Pupils: Pupils are equal, round, and reactive to light.  Neck:     Thyroid: No thyromegaly.     Vascular: No JVD.     Trachea: No tracheal deviation.  Cardiovascular:     Rate and Rhythm: Normal rate and regular rhythm.     Pulses: Intact distal pulses.     Heart sounds: Normal heart sounds. No murmur heard. No friction rub. No gallop.   Pulmonary:     Effort: No respiratory distress.     Breath sounds: Normal breath sounds. No decreased breath sounds, wheezing, rhonchi or rales.  Chest:     Chest wall: No tenderness or crepitus.  Abdominal:     General: Bowel sounds are normal.     Palpations: Abdomen is soft. There is no hepatosplenomegaly or mass.     Tenderness: There is no abdominal tenderness. There is no CVA tenderness, guarding or rebound.  Musculoskeletal:        General: No tenderness or edema. Normal range of motion.     Cervical back: Normal range of motion and neck supple.  Lymphadenopathy:     Cervical: No cervical adenopathy.  Skin:    General: Skin is warm.     Findings: No rash.  Neurological:     Mental Status: He is alert and oriented to person, place, and time.     Cranial Nerves: No cranial nerve deficit.     Deep Tendon Reflexes: Strength normal and reflexes are normal and symmetric.     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  Stable.  Patient was diagnosed with Covid positive on December 7 and had a relatively uneventful course.  Patient is unvaccinated and unlikely to get vaccinated in the future.  Exam is unremarkable for pulmonary issues.  Chest x-ray was done and is on remarkable for any cardiopulmonary disease.  This is likely a sequelae of the post Covid.  And is likely to gradually improve but may to some extent linger as conveyed to the patient.  Patient will continue to monitor and return if things should worsen - DG Chest 2 View; Future

## 2020-05-30 ENCOUNTER — Other Ambulatory Visit: Payer: Self-pay | Admitting: Family Medicine

## 2020-05-30 DIAGNOSIS — I10 Essential (primary) hypertension: Secondary | ICD-10-CM

## 2020-06-27 ENCOUNTER — Ambulatory Visit: Payer: No Typology Code available for payment source | Admitting: Family Medicine

## 2020-06-27 ENCOUNTER — Encounter: Payer: Self-pay | Admitting: Family Medicine

## 2020-06-27 ENCOUNTER — Other Ambulatory Visit: Payer: Self-pay

## 2020-06-27 VITALS — BP 132/96 | HR 72 | Ht 67.0 in | Wt 184.0 lb

## 2020-06-27 DIAGNOSIS — I10 Essential (primary) hypertension: Secondary | ICD-10-CM | POA: Diagnosis not present

## 2020-06-27 DIAGNOSIS — K219 Gastro-esophageal reflux disease without esophagitis: Secondary | ICD-10-CM

## 2020-06-27 DIAGNOSIS — B349 Viral infection, unspecified: Secondary | ICD-10-CM | POA: Diagnosis not present

## 2020-06-27 DIAGNOSIS — E785 Hyperlipidemia, unspecified: Secondary | ICD-10-CM | POA: Diagnosis not present

## 2020-06-27 DIAGNOSIS — Z8619 Personal history of other infectious and parasitic diseases: Secondary | ICD-10-CM

## 2020-06-27 MED ORDER — ACYCLOVIR 200 MG PO CAPS: 200.0000 mg | ORAL_CAPSULE | Freq: Every day | ORAL | 5 refills | Status: DC

## 2020-06-27 MED ORDER — HYDROCHLOROTHIAZIDE 12.5 MG PO TABS: 12.5000 mg | ORAL_TABLET | Freq: Every day | ORAL | 0 refills | Status: DC

## 2020-06-27 MED ORDER — ESOMEPRAZOLE MAGNESIUM 40 MG PO CPDR: 40.0000 mg | DELAYED_RELEASE_CAPSULE | Freq: Every day | ORAL | 5 refills | Status: DC

## 2020-06-27 MED ORDER — LOSARTAN POTASSIUM 100 MG PO TABS: 100.0000 mg | ORAL_TABLET | Freq: Every day | ORAL | 0 refills | Status: DC

## 2020-06-27 NOTE — Progress Notes (Signed)
Date:  06/27/2020   Name:  Matthew Zuniga.   DOB:  12-08-1970   MRN:  [id number]   Chief Complaint: Gastroesophageal Reflux, Hypertension, and viral syndrome  Gastroesophageal Reflux He reports no abdominal pain, no belching, no chest pain, no choking, no coughing, no dysphagia, no early satiety, no globus sensation, no heartburn, no hoarse voice, no nausea, no sore throat, no stridor, no tooth decay, no water brash or no wheezing. This is a chronic problem. The current episode started more than 1 year ago. The problem occurs rarely. The problem has been gradually improving. Nothing aggravates the symptoms. Pertinent negatives include no anemia, fatigue, melena, muscle weakness, orthopnea or weight loss. He has tried a PPI for the symptoms. The treatment provided moderate relief.  Hypertension This is a chronic problem. The current episode started more than 1 year ago. The problem has been waxing and waning since onset. The problem is controlled. Pertinent negatives include no anxiety, blurred vision, chest pain, headaches, malaise/fatigue, neck pain, orthopnea, palpitations, peripheral edema, PND, shortness of breath or sweats. There are no associated agents to hypertension. There are no known risk factors for coronary artery disease. Past treatments include angiotensin blockers. The current treatment provides moderate improvement. There are no compliance problems.  There is no history of angina, kidney disease, CAD/MI, CVA, heart failure, left ventricular hypertrophy, PVD or retinopathy. There is no history of chronic renal disease, a hypertension causing med or renovascular disease.    Lab Results  Component Value Date   CREATININE 0.90 02/06/2019   BUN 14 02/06/2019   NA 138 02/06/2019   K 4.3 02/06/2019   CL 98 02/06/2019   CO2 25 02/06/2019   Lab Results  Component Value Date   CHOL 256 (H) 02/06/2019   HDL 105 02/06/2019   LDLCALC 141 (H) 02/06/2019   TRIG 60 02/06/2019    CHOLHDL 2.4 02/06/2019   No results found for: TSH No results found for: HGBA1C Lab Results  Component Value Date   WBC 5.9 11/01/2014   HGB 14.3 11/01/2014   HCT 41.1 11/01/2014   MCV 98 (H) 11/01/2014   PLT 242 11/01/2014   Lab Results  Component Value Date   ALT 33 11/01/2014   AST 30 11/01/2014   ALKPHOS 67 11/01/2014   BILITOT 0.4 11/01/2014     Review of Systems  Constitutional: Negative for chills, fatigue, fever, malaise/fatigue and weight loss.  HENT: Negative for drooling, ear discharge, ear pain, hoarse voice and sore throat.   Eyes: Negative for blurred vision.  Respiratory: Negative for cough, choking, shortness of breath and wheezing.   Cardiovascular: Negative for chest pain, palpitations, orthopnea, leg swelling and PND.  Gastrointestinal: Negative for abdominal pain, blood in stool, constipation, diarrhea, dysphagia, heartburn, melena and nausea.  Endocrine: Negative for polydipsia.  Genitourinary: Negative for dysuria, frequency, hematuria and urgency.  Musculoskeletal: Negative for back pain, myalgias, muscle weakness and neck pain.  Skin: Negative for rash.  Allergic/Immunologic: Negative for environmental allergies.  Neurological: Negative for dizziness and headaches.  Hematological: Does not bruise/bleed easily.  Psychiatric/Behavioral: Negative for suicidal ideas. The patient is not nervous/anxious.     Patient Active Problem List   Diagnosis Date Noted  . Hyperlipidemia 09/11/2016  . 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 Alcohol intoxication ([place]) 04/22/2016  . Pneumonia 04/22/2016  . Hypertension 12/02/2015  . GERD (gastroesophageal reflux disease) 12/02/2015  . Viral syndrome 12/02/2015    No Known Allergies  No past surgical history on  file.  Social History   Tobacco Use  . Smoking status: Never Smoker  . Smokeless tobacco: Current User    Types: Snuff  Substance Use Topics  . Alcohol use: Yes    Alcohol/week: 6.0 standard drinks     Types: 6 Cans of beer per week    Comment: daily  . Drug use: Never     Medication list has been reviewed and updated.  Current Meds  Medication Sig  . acyclovir (ZOVIRAX) 200 MG capsule Take 1 capsule (200 mg total) by mouth daily.  Marland Kitchen esomeprazole (NEXIUM) 40 MG capsule Take 1 capsule (40 mg total) by mouth daily at 12 noon. otc  . losartan (COZAAR) 100 MG tablet Take 1 tablet by mouth once daily  . milk thistle 175 MG tablet Take 175 mg by mouth daily.  . Multiple Vitamin (MULTIVITAMIN WITH MINERALS) TABS tablet Take 1 tablet by mouth daily.  . Omega-3 1000 MG CAPS Take 1 capsule (1,000 mg total) by mouth daily.    PHQ 2/9 Scores 09/03/2019 04/21/2019 02/06/2019 02/25/2017  PHQ - 2 Score 0 0 0 0  PHQ- 9 Score 0 0 0 0    GAD 7 : Generalized Anxiety Score 09/03/2019 04/21/2019  Nervous, Anxious, on Edge 0 0  Control/stop worrying 0 0  Worry too much - different things 0 0  Trouble relaxing 0 0  Restless 0 0  Easily annoyed or irritable 0 0  Afraid - awful might happen 0 0  Total GAD 7 Score 0 0    BP Readings from Last 3 Encounters:  06/27/20 (!) 138/100  05/27/20 120/62  02/19/20 120/80    Physical Exam Vitals reviewed.  HENT:     Head: Normocephalic.     Right Ear: Tympanic membrane, ear canal and external ear normal.     Left Ear: Tympanic membrane, ear canal and external ear normal.     Nose: Nose normal. No congestion or rhinorrhea.     Mouth/Throat:     Mouth: Oropharynx is clear and moist. Mucous membranes are moist.  Eyes:     General: No scleral icterus.       Right eye: No discharge.        Left eye: No discharge.     Extraocular Movements: EOM normal.     Conjunctiva/sclera: Conjunctivae normal.     Pupils: Pupils are equal, round, and reactive to light.  Neck:     Thyroid: No thyromegaly.     Vascular: No JVD.     Trachea: No tracheal deviation.  Cardiovascular:     Rate and Rhythm: Normal rate and regular rhythm.     Pulses: Intact distal pulses.      Heart sounds: Normal heart sounds. No murmur heard. No friction rub. No gallop.   Pulmonary:     Effort: No respiratory distress.     Breath sounds: Normal breath sounds. No wheezing, rhonchi or rales.  Abdominal:     General: Bowel sounds are normal.     Palpations: Abdomen is soft. There is no hepatosplenomegaly or mass.     Tenderness: There is no abdominal tenderness. There is no CVA tenderness, guarding or rebound.  Musculoskeletal:        General: No tenderness or edema. Normal range of motion.     Cervical back: Normal range of motion and neck supple.  Lymphadenopathy:     Cervical: No cervical adenopathy.  Skin:    General: Skin is warm.     Findings:  No rash.  Neurological:     Mental Status: He is alert and oriented to person, place, and time.     Cranial Nerves: No cranial nerve deficit.     Deep Tendon Reflexes: Strength normal and reflexes are normal and symmetric.     Wt Readings from Last 3 Encounters:  06/27/20 184 lb (83.5 kg)  05/27/20 177 lb (80.3 kg)  02/19/20 175 lb (79.4 kg)    BP (!) 138/100   Pulse 72   Ht 5\' 7"  (1.702 m)   Wt 184 lb (83.5 kg)   BMI 28.82 kg/m   Assessment and Plan: 1. Essential hypertension Chronic.  Uncontrolled.  Stable.  Blood pressure 132/96.  We will maintain losartan at 100 mg but add hydrochlorothiazide 12.5 mg once a day.  We will recheck blood pressure in 11 weeks. - losartan (COZAAR) 100 MG tablet; Take 1 tablet (100 mg total) by mouth daily.  Dispense: 90 tablet; Refill: 0 - hydrochlorothiazide (HYDRODIURIL) 12.5 MG tablet; Take 1 tablet (12.5 mg total) by mouth daily.  Dispense: 90 tablet; Refill: 0  2. Hyperlipidemia, unspecified hyperlipidemia type Chronic.  Controlled.  Stable.  Patient will continue omega-3 and will check lipid panel on next visit.  3. Gastroesophageal reflux disease Chronic.  Controlled.  Stable.  Continue Nexium 40 mg once a day. - esomeprazole (NEXIUM) 40 MG capsule; Take 1 capsule (40  mg total) by mouth daily at 12 noon. otc  Dispense: 30 capsule; Refill: 5  4. Viral syndrome Chronic.  Controlled.  Stable.  Patient will continue acyclovir 200 mg capsule as needed. - acyclovir (ZOVIRAX) 200 MG capsule; Take 1 capsule (200 mg total) by mouth daily.  Dispense: 30 capsule; Refill: 5  5. H/O cold sores As noted above. - acyclovir (ZOVIRAX) 200 MG capsule; Take 1 capsule (200 mg total) by mouth daily.  Dispense: 30 capsule; Refill: 5

## 2020-07-21 ENCOUNTER — Telehealth: Payer: Self-pay

## 2020-07-21 ENCOUNTER — Other Ambulatory Visit: Payer: Self-pay

## 2020-07-21 DIAGNOSIS — I10 Essential (primary) hypertension: Secondary | ICD-10-CM

## 2020-07-21 MED ORDER — METOPROLOL SUCCINATE ER 25 MG PO TB24: 25.0000 mg | ORAL_TABLET | Freq: Every day | ORAL | 1 refills | Status: DC

## 2020-07-21 NOTE — Telephone Encounter (Signed)
Sent in metoprolol in place of HCTZ- spoke to pt

## 2020-07-21 NOTE — Telephone Encounter (Signed)
Copied from [place] [id number]. Topic: General - Other >> Jul 21, 2020 10:24 AM Leward Quan A wrote: Reason for CRM: Patient called in to inform Dr Ronnald Ramp that he have had a headache diarrhea and not feeling well since he started taking the hydrochlorothiazide (HYDRODIURIL) 12.5 MG tablet. Asking for a call back from Dr Ronnald Ramp to discuss this and make changes Ph#  [phone]

## 2020-07-21 NOTE — Progress Notes (Signed)
D/c HCTZ and sent in metoprolol

## 2020-07-22 ENCOUNTER — Ambulatory Visit: Payer: Self-pay

## 2020-07-22 NOTE — Telephone Encounter (Signed)
calling stating that he received a new medication, metoprolol, he states that he was previously taking two medications for BP and is not sure if he should continue taking these or cut them off. Please advise   Pt. Calling to verify BP medications. He is to stop HCTZ and start metoprolol in conjunction with losartan. Verbalizes understanding.

## 2020-09-01 ENCOUNTER — Other Ambulatory Visit: Payer: Self-pay | Admitting: Family Medicine

## 2020-09-01 DIAGNOSIS — I10 Essential (primary) hypertension: Secondary | ICD-10-CM

## 2020-09-06 ENCOUNTER — Other Ambulatory Visit: Payer: Self-pay

## 2020-09-06 ENCOUNTER — Encounter: Payer: Self-pay | Admitting: Family Medicine

## 2020-09-06 ENCOUNTER — Ambulatory Visit: Payer: No Typology Code available for payment source | Admitting: Family Medicine

## 2020-09-06 VITALS — BP 130/70 | HR 96 | Temp 98.0°F | Ht 67.0 in | Wt 176.0 lb

## 2020-09-06 DIAGNOSIS — J0100 Acute maxillary sinusitis, unspecified: Secondary | ICD-10-CM

## 2020-09-06 MED ORDER — AZITHROMYCIN 250 MG PO TABS: ORAL_TABLET | ORAL | 0 refills | Status: DC

## 2020-09-06 NOTE — Progress Notes (Signed)
Date:  09/06/2020   Name:  Matthew Zuniga.   DOB:  [birth year]   MRN:  [id number]   Chief Complaint: Sore Throat (Started Saturday. Little drainage in throat. Sore on right side of throat. Hurts to swallow. Right ear pain.)  Sore Throat  This is a new problem. The current episode started in the past 7 days (onset Saturday). The problem has been waxing and waning. The pain is worse on the right side. There has been no fever. The pain is moderate. Associated symptoms include ear pain, headaches, neck pain and trouble swallowing. Pertinent negatives include no abdominal pain, congestion, coughing, diarrhea, drooling, ear discharge, hoarse voice, plugged ear sensation, shortness of breath, stridor, swollen glands or vomiting. He has had no exposure to strep or mono. He has tried nothing for the symptoms.    Lab Results  Component Value Date   CREATININE 0.90 02/06/2019   BUN 14 02/06/2019   NA 138 02/06/2019   K 4.3 02/06/2019   CL 98 02/06/2019   CO2 25 02/06/2019   Lab Results  Component Value Date   CHOL 256 (H) 02/06/2019   HDL 105 02/06/2019   LDLCALC 141 (H) 02/06/2019   TRIG 60 02/06/2019   CHOLHDL 2.4 02/06/2019   No results found for: TSH No results found for: HGBA1C Lab Results  Component Value Date   WBC 5.9 11/01/2014   HGB 14.3 11/01/2014   HCT 41.1 11/01/2014   MCV 98 (H) 11/01/2014   PLT 242 11/01/2014   Lab Results  Component Value Date   ALT 33 11/01/2014   AST 30 11/01/2014   ALKPHOS 67 11/01/2014   BILITOT 0.4 11/01/2014     Review of Systems  Constitutional: Negative for chills and fever.  HENT: Positive for ear pain and trouble swallowing. Negative for congestion, drooling, ear discharge, hoarse voice and sore throat.   Respiratory: Negative for cough, shortness of breath, wheezing and stridor.   Cardiovascular: Negative for chest pain, palpitations and leg swelling.  Gastrointestinal: Negative for abdominal pain, blood in stool,  constipation, diarrhea, nausea and vomiting.  Endocrine: Negative for polydipsia.  Genitourinary: Negative for dysuria, frequency, hematuria and urgency.  Musculoskeletal: Positive for arthralgias, back pain and neck pain. Negative for myalgias.  Skin: Negative for rash.  Allergic/Immunologic: Negative for environmental allergies.  Neurological: Positive for headaches. Negative for dizziness.  Hematological: Does not bruise/bleed easily.  Psychiatric/Behavioral: Negative for suicidal ideas. The patient is not nervous/anxious.     Patient Active Problem List   Diagnosis Date Noted  . Hyperlipidemia 09/11/2016  . 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 Alcohol intoxication ([place]) 04/22/2016  . Pneumonia 04/22/2016  . Hypertension 12/02/2015  . GERD (gastroesophageal reflux disease) 12/02/2015  . Viral syndrome 12/02/2015    No Known Allergies  History reviewed. No pertinent surgical history.  Social History   Tobacco Use  . Smoking status: Never Smoker  . Smokeless tobacco: Current User    Types: Snuff  Substance Use Topics  . Alcohol use: Yes    Alcohol/week: 6.0 standard drinks    Types: 6 Cans of beer per week    Comment: daily  . Drug use: Never     Medication list has been reviewed and updated.  Current Meds  Medication Sig  . acyclovir (ZOVIRAX) 200 MG capsule Take 1 capsule (200 mg total) by mouth daily.  Marland Kitchen esomeprazole (NEXIUM) 40 MG capsule Take 1 capsule (40 mg total) by mouth daily at 12 noon. otc  .  losartan (COZAAR) 100 MG tablet Take 1 tablet (100 mg total) by mouth daily.  . metoprolol succinate (TOPROL-XL) 25 MG 24 hr tablet Take 1 tablet (25 mg total) by mouth daily.  . milk thistle 175 MG tablet Take 175 mg by mouth daily.  . Multiple Vitamin (MULTIVITAMIN WITH MINERALS) TABS tablet Take 1 tablet by mouth daily.  . Omega-3 1000 MG CAPS Take 1 capsule (1,000 mg total) by mouth daily.    PHQ 2/9 Scores 09/06/2020 09/03/2019 04/21/2019 02/06/2019  PHQ - 2 Score 0 0  0 0  PHQ- 9 Score 0 0 0 0    GAD 7 : Generalized Anxiety Score 09/06/2020 09/03/2019 04/21/2019  Nervous, Anxious, on Edge 0 0 0  Control/stop worrying 0 0 0  Worry too much - different things 0 0 0  Trouble relaxing 0 0 0  Restless 0 0 0  Easily annoyed or irritable 0 0 0  Afraid - awful might happen 0 0 0  Total GAD 7 Score 0 0 0  Anxiety Difficulty Not difficult at all - -    BP Readings from Last 3 Encounters:  09/06/20 130/70  06/27/20 (!) 132/96  05/27/20 120/62    Physical Exam Vitals and nursing note reviewed.  HENT:     Head: Normocephalic.     Jaw: There is normal jaw occlusion.     Salivary Glands: Right salivary gland is not diffusely enlarged or tender. Left salivary gland is not diffusely enlarged or tender.     Comments: RIGHTN SCALP TENDERNESS    Right Ear: Tympanic membrane, ear canal and external ear normal. No drainage or tenderness.     Left Ear: Tympanic membrane, ear canal and external ear normal. No drainage or tenderness.     Nose: Congestion present. No rhinorrhea.     Right Sinus: Maxillary sinus tenderness and frontal sinus tenderness present.     Left Sinus: Maxillary sinus tenderness and frontal sinus tenderness present.     Mouth/Throat:     Mouth: Mucous membranes are moist.     Pharynx: Oropharynx is clear. Uvula midline. Posterior oropharyngeal erythema present.  Eyes:     General: No scleral icterus.       Right eye: No discharge.        Left eye: No discharge.     Conjunctiva/sclera: Conjunctivae normal.     Pupils: Pupils are equal, round, and reactive to light.  Neck:     Thyroid: No thyromegaly.     Vascular: Normal carotid pulses. No carotid bruit, hepatojugular reflux or JVD.     Trachea: Trachea normal. No tracheal deviation.  Cardiovascular:     Rate and Rhythm: Normal rate and regular rhythm.     Heart sounds: Normal heart sounds, S1 normal and S2 normal. No murmur heard.  No systolic murmur is present.  No diastolic murmur is  present. No friction rub. No gallop. No S3 or S4 sounds.   Pulmonary:     Effort: Pulmonary effort is normal. No respiratory distress.     Breath sounds: Normal breath sounds. No decreased breath sounds, wheezing, rhonchi or rales.  Abdominal:     General: Bowel sounds are normal.     Palpations: Abdomen is soft. There is no hepatomegaly, splenomegaly or mass.     Tenderness: There is no abdominal tenderness. There is no guarding or rebound.  Musculoskeletal:        General: No tenderness. Normal range of motion.     Cervical back: Normal  range of motion and neck supple.     Right lower leg: No edema.     Left lower leg: No edema.  Lymphadenopathy:     Cervical: No cervical adenopathy.     Right cervical: No deep or posterior cervical adenopathy.    Left cervical: No deep cervical adenopathy.  Skin:    General: Skin is warm.     Findings: No rash.  Neurological:     Mental Status: He is alert and oriented to person, place, and time.     Cranial Nerves: No cranial nerve deficit.     Deep Tendon Reflexes: Reflexes are normal and symmetric.     Wt Readings from Last 3 Encounters:  09/06/20 176 lb (79.8 kg)  06/27/20 184 lb (83.5 kg)  05/27/20 177 lb (80.3 kg)    BP 130/70   Pulse 96   Temp 98 F (36.7 C) (Oral)   Ht 5\' 7"  (1.702 m)   Wt 176 lb (79.8 kg)   SpO2 98%   BMI 27.57 kg/m   Assessment and Plan: 1. Acute non-recurrent maxillary sinusitis Acute.  Persistent.  Stable.  Patient has had sore throat and sinus discomfort for the past couple days.  Exam is consistent with tenderness over the left maxillary sinus.  We will treat with azithromycin to 50 mg 2 beginning today continue with 1 a day for 4 days.

## 2020-09-13 ENCOUNTER — Other Ambulatory Visit: Payer: Self-pay

## 2020-09-13 ENCOUNTER — Encounter: Payer: Self-pay | Admitting: Family Medicine

## 2020-09-13 ENCOUNTER — Ambulatory Visit: Payer: No Typology Code available for payment source | Admitting: Family Medicine

## 2020-09-13 VITALS — BP 130/70 | HR 80 | Ht 67.0 in | Wt 173.0 lb

## 2020-09-13 DIAGNOSIS — E785 Hyperlipidemia, unspecified: Secondary | ICD-10-CM | POA: Diagnosis not present

## 2020-09-13 DIAGNOSIS — I10 Essential (primary) hypertension: Secondary | ICD-10-CM

## 2020-09-13 DIAGNOSIS — B349 Viral infection, unspecified: Secondary | ICD-10-CM | POA: Diagnosis not present

## 2020-09-13 MED ORDER — LOSARTAN POTASSIUM 100 MG PO TABS: 100.0000 mg | ORAL_TABLET | Freq: Every day | ORAL | 0 refills | Status: DC

## 2020-09-13 MED ORDER — ACYCLOVIR 200 MG PO CAPS: 200.0000 mg | ORAL_CAPSULE | Freq: Every day | ORAL | 1 refills | Status: DC

## 2020-09-13 MED ORDER — METOPROLOL SUCCINATE ER 25 MG PO TB24: 25.0000 mg | ORAL_TABLET | Freq: Every day | ORAL | 1 refills | Status: DC

## 2020-09-13 NOTE — Progress Notes (Signed)
Date:  09/13/2020   Name:  Matthew Zuniga.   DOB:  11-26-1970   MRN:  [id number]   Chief Complaint: Hypertension and viral syndrome  Hypertension This is a chronic problem. The current episode started more than 1 year ago. The problem has been gradually improving since onset. The problem is controlled. Pertinent negatives include no chest pain, headaches, neck pain, orthopnea, palpitations, peripheral edema, PND or shortness of breath. The current treatment provides moderate improvement. There are no compliance problems.  There is no history of angina, kidney disease, CAD/MI, CVA, heart failure, left ventricular hypertrophy, PVD or retinopathy. There is no history of chronic renal disease, a hypertension causing med or renovascular disease.    Lab Results  Component Value Date   CREATININE 0.90 02/06/2019   BUN 14 02/06/2019   NA 138 02/06/2019   K 4.3 02/06/2019   CL 98 02/06/2019   CO2 25 02/06/2019   Lab Results  Component Value Date   CHOL 256 (H) 02/06/2019   HDL 105 02/06/2019   LDLCALC 141 (H) 02/06/2019   TRIG 60 02/06/2019   CHOLHDL 2.4 02/06/2019   No results found for: TSH No results found for: HGBA1C Lab Results  Component Value Date   WBC 5.9 11/01/2014   HGB 14.3 11/01/2014   HCT 41.1 11/01/2014   MCV 98 (H) 11/01/2014   PLT 242 11/01/2014   Lab Results  Component Value Date   ALT 33 11/01/2014   AST 30 11/01/2014   ALKPHOS 67 11/01/2014   BILITOT 0.4 11/01/2014     Review of Systems  Constitutional: Negative for chills and fever.  HENT: Negative for drooling, ear discharge, ear pain and sore throat.   Respiratory: Negative for cough, shortness of breath and wheezing.   Cardiovascular: Negative for chest pain, palpitations, orthopnea, leg swelling and PND.  Gastrointestinal: Negative for abdominal pain, blood in stool, constipation, diarrhea and nausea.  Endocrine: Negative for polydipsia.  Genitourinary: Negative for dysuria, frequency,  hematuria and urgency.  Musculoskeletal: Negative for back pain, myalgias and neck pain.  Skin: Negative for rash.  Allergic/Immunologic: Negative for environmental allergies.  Neurological: Negative for dizziness and headaches.  Hematological: Does not bruise/bleed easily.  Psychiatric/Behavioral: Negative for suicidal ideas. The patient is not nervous/anxious.     Patient Active Problem List   Diagnosis Date Noted  . Hyperlipidemia 09/11/2016  . 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 Alcohol intoxication ([place]) 04/22/2016  . Pneumonia 04/22/2016  . Hypertension 12/02/2015  . GERD (gastroesophageal reflux disease) 12/02/2015  . Viral syndrome 12/02/2015    No Known Allergies  No past surgical history on file.  Social History   Tobacco Use  . Smoking status: Never Smoker  . Smokeless tobacco: Current User    Types: Snuff  Substance Use Topics  . Alcohol use: Yes    Alcohol/week: 6.0 standard drinks    Types: 6 Cans of beer per week    Comment: daily  . Drug use: Never     Medication list has been reviewed and updated.  Current Meds  Medication Sig  . acyclovir (ZOVIRAX) 200 MG capsule Take 1 capsule (200 mg total) by mouth daily.  Marland Kitchen esomeprazole (NEXIUM) 40 MG capsule Take 1 capsule (40 mg total) by mouth daily at 12 noon. otc  . losartan (COZAAR) 100 MG tablet Take 1 tablet (100 mg total) by mouth daily.  . metoprolol succinate (TOPROL-XL) 25 MG 24 hr tablet Take 1 tablet (25 mg total) by mouth  daily.  . milk thistle 175 MG tablet Take 175 mg by mouth daily.  . Multiple Vitamin (MULTIVITAMIN WITH MINERALS) TABS tablet Take 1 tablet by mouth daily.  . Omega-3 1000 MG CAPS Take 1 capsule (1,000 mg total) by mouth daily.    PHQ 2/9 Scores 09/13/2020 09/06/2020 09/03/2019 04/21/2019  PHQ - 2 Score 0 0 0 0  PHQ- 9 Score 0 0 0 0    GAD 7 : Generalized Anxiety Score 09/13/2020 09/06/2020 09/03/2019 04/21/2019  Nervous, Anxious, on Edge 0 0 0 0  Control/stop worrying 0 0 0 0  Worry  too much - different things 0 0 0 0  Trouble relaxing 0 0 0 0  Restless 0 0 0 0  Easily annoyed or irritable 0 0 0 0  Afraid - awful might happen 0 0 0 0  Total GAD 7 Score 0 0 0 0  Anxiety Difficulty - Not difficult at all - -    BP Readings from Last 3 Encounters:  09/13/20 130/70  09/06/20 130/70  06/27/20 (!) 132/96    Physical Exam Vitals and nursing note reviewed.  HENT:     Head: Normocephalic.     Right Ear: Tympanic membrane and external ear normal.     Left Ear: Tympanic membrane and external ear normal.     Nose: Nose normal.  Eyes:     General: No scleral icterus.       Right eye: No discharge.        Left eye: No discharge.     Conjunctiva/sclera: Conjunctivae normal.     Pupils: Pupils are equal, round, and reactive to light.  Neck:     Thyroid: No thyromegaly.     Vascular: No JVD.     Trachea: No tracheal deviation.  Cardiovascular:     Rate and Rhythm: Normal rate and regular rhythm.     Heart sounds: Normal heart sounds. No murmur heard. No friction rub. No gallop.   Pulmonary:     Effort: No respiratory distress.     Breath sounds: Normal breath sounds. No wheezing or rales.  Abdominal:     General: Bowel sounds are normal.     Palpations: Abdomen is soft. There is no mass.     Tenderness: There is no abdominal tenderness. There is no guarding or rebound.  Musculoskeletal:        General: No tenderness. Normal range of motion.     Cervical back: Normal range of motion and neck supple.  Lymphadenopathy:     Cervical: No cervical adenopathy.  Skin:    General: Skin is warm.     Findings: No rash.  Neurological:     Mental Status: He is alert and oriented to person, place, and time.     Cranial Nerves: No cranial nerve deficit.     Deep Tendon Reflexes: Reflexes are normal and symmetric.     Wt Readings from Last 3 Encounters:  09/13/20 173 lb (78.5 kg)  09/06/20 176 lb (79.8 kg)  06/27/20 184 lb (83.5 kg)    BP 130/70   Pulse 80   Ht  5\' 7"  (1.702 m)   Wt 173 lb (78.5 kg)   BMI 27.10 kg/m   Assessment and Plan: 1. Essential hypertension Chronic.  Controlled.  Stable.  Blood pressure 130/70.  Continue metoprolol XL 25 mg and losartan 100 mg once a day.  Will check renal function panel for GFR and electrolytes. - metoprolol succinate (TOPROL-XL) 25 MG 24 hr  tablet; Take 1 tablet (25 mg total) by mouth daily.  Dispense: 90 tablet; Refill: 1 - losartan (COZAAR) 100 MG tablet; Take 1 tablet (100 mg total) by mouth daily.  Dispense: 90 tablet; Refill: 0 - Renal Function Panel  2. Viral syndrome Chronic.  Controlled.  Stable.  Patient takes on a daily basis 200 mg acyclovir for suppression. - acyclovir (ZOVIRAX) 200 MG capsule; Take 1 capsule (200 mg total) by mouth daily.  Dispense: 90 capsule; Refill: 1  3. Hyperlipidemia, unspecified hyperlipidemia type Chronic.  Controlled.  Stable.  Only total cholesterol elevated in the past patient is taking omega-3 and will following a low-cholesterol low triglyceride diet. - Lipid Panel With LDL/HDL Ratio

## 2020-09-14 LAB — RENAL FUNCTION PANEL
Albumin: 4.6 g/dL (ref 4.0–5.0)
BUN/Creatinine Ratio: 11 (ref 9–20)
BUN: 9 mg/dL (ref 6–24)
CO2: 25 mmol/L (ref 20–29)
Calcium: 9.9 mg/dL (ref 8.7–10.2)
Chloride: 101 mmol/L (ref 96–106)
Creatinine, Ser: 0.83 mg/dL (ref 0.76–1.27)
Glucose: 74 mg/dL (ref 65–99)
Phosphorus: 3.9 mg/dL (ref 2.8–4.1)
Potassium: 4.4 mmol/L (ref 3.5–5.2)
Sodium: 143 mmol/L (ref 134–144)
eGFR: 109 mL/min/{1.73_m2} (ref >59)

## 2020-09-14 LAB — LIPID PANEL WITH LDL/HDL RATIO
Cholesterol, Total: 234 mg/dL — ABNORMAL HIGH (ref 100–199)
HDL: 87 mg/dL (ref >39)
LDL Chol Calc (NIH): 121 mg/dL — ABNORMAL HIGH (ref 0–99)
LDL/HDL Ratio: 1.4 ratio (ref 0.0–3.6)
Triglycerides: 153 mg/dL — ABNORMAL HIGH (ref 0–149)
VLDL Cholesterol Cal: 26 mg/dL (ref 5–40)

## 2020-09-26 ENCOUNTER — Ambulatory Visit
Admission: RE | Admit: 2020-09-26 | Discharge: 2020-09-26 | Disposition: A | Discharge status: Home / Self Care | Payer: No Typology Code available for payment source | Source: Ambulatory Visit | Attending: Family Medicine | Admitting: Family Medicine

## 2020-09-26 ENCOUNTER — Other Ambulatory Visit: Payer: Self-pay

## 2020-09-26 ENCOUNTER — Encounter: Payer: Self-pay | Admitting: Family Medicine

## 2020-09-26 ENCOUNTER — Ambulatory Visit: Payer: No Typology Code available for payment source | Admitting: Family Medicine

## 2020-09-26 ENCOUNTER — Ambulatory Visit
Admission: RE | Admit: 2020-09-26 | Discharge: 2020-09-26 | Disposition: A | Discharge status: Home / Self Care | Payer: No Typology Code available for payment source | Attending: Family Medicine | Admitting: Family Medicine

## 2020-09-26 VITALS — BP 132/80 | HR 88 | Temp 98.3°F | Ht 67.0 in | Wt 173.0 lb

## 2020-09-26 DIAGNOSIS — J0101 Acute recurrent maxillary sinusitis: Secondary | ICD-10-CM | POA: Diagnosis present

## 2020-09-26 MED ORDER — MOMETASONE FUROATE 50 MCG/ACT NA SUSP: 2.0000 | Freq: Every day | NASAL | 12 refills | Status: DC

## 2020-09-26 MED ORDER — AMOXICILLIN-POT CLAVULANATE 875-125 MG PO TABS: 1.0000 | ORAL_TABLET | Freq: Two times a day (BID) | ORAL | 0 refills | Status: DC

## 2020-09-26 MED ORDER — MONTELUKAST SODIUM 10 MG PO TABS: 10.0000 mg | ORAL_TABLET | Freq: Every day | ORAL | 3 refills | Status: DC

## 2020-09-26 NOTE — Progress Notes (Signed)
Date:  09/26/2020   Name:  Matthew Zuniga.   DOB:  15-Oct-1970   MRN:  [id number]   Chief Complaint: Sinusitis (Drainage, clear production- finished [employer] on April 29th)  Sinusitis This is a recurrent problem. The current episode started yesterday. The problem has been gradually worsening since onset. There has been no fever. The pain is mild. Associated symptoms include congestion, coughing, headaches, sinus pressure and sneezing. Pertinent negatives include no chills, diaphoresis, ear pain, hoarse voice, neck pain, shortness of breath or sore throat. Past treatments include antibiotics and saline sprays (zyrtec). The treatment provided mild relief.    Lab Results  Component Value Date   CREATININE 0.83 09/13/2020   BUN 9 09/13/2020   NA 143 09/13/2020   K 4.4 09/13/2020   CL 101 09/13/2020   CO2 25 09/13/2020   Lab Results  Component Value Date   CHOL 234 (H) 09/13/2020   HDL 87 09/13/2020   LDLCALC 121 (H) 09/13/2020   TRIG 153 (H) 09/13/2020   CHOLHDL 2.4 02/06/2019   No results found for: TSH No results found for: HGBA1C Lab Results  Component Value Date   WBC 5.9 11/01/2014   HGB 14.3 11/01/2014   HCT 41.1 11/01/2014   MCV 98 (H) 11/01/2014   PLT 242 11/01/2014   Lab Results  Component Value Date   ALT 33 11/01/2014   AST 30 11/01/2014   ALKPHOS 67 11/01/2014   BILITOT 0.4 11/01/2014     Review of Systems  Constitutional: Negative for chills and diaphoresis.  HENT: Positive for congestion, postnasal drip, rhinorrhea, sinus pressure, sinus pain and sneezing. Negative for ear pain, facial swelling, hoarse voice, nosebleeds and sore throat.   Respiratory: Positive for cough. Negative for shortness of breath.   Musculoskeletal: Negative for neck pain.  Neurological: Positive for headaches.    Patient Active Problem List   Diagnosis Date Noted  . Hyperlipidemia 09/11/2016  . 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 Alcohol intoxication ([place]) 04/22/2016  .  Pneumonia 04/22/2016  . Hypertension 12/02/2015  . GERD (gastroesophageal reflux disease) 12/02/2015  . Viral syndrome 12/02/2015    No Known Allergies  No past surgical history on file.  Social History   Tobacco Use  . Smoking status: Never Smoker  . Smokeless tobacco: Current User    Types: Snuff  Substance Use Topics  . Alcohol use: Yes    Alcohol/week: 6.0 standard drinks    Types: 6 Cans of beer per week    Comment: daily  . Drug use: Never     Medication list has been reviewed and updated.  Current Meds  Medication Sig  . acyclovir (ZOVIRAX) 200 MG capsule Take 1 capsule (200 mg total) by mouth daily.  Marland Kitchen esomeprazole (NEXIUM) 40 MG capsule Take 1 capsule (40 mg total) by mouth daily at 12 noon. otc  . losartan (COZAAR) 100 MG tablet Take 1 tablet (100 mg total) by mouth daily.  . metoprolol succinate (TOPROL-XL) 25 MG 24 hr tablet Take 1 tablet (25 mg total) by mouth daily.  . milk thistle 175 MG tablet Take 175 mg by mouth daily.  . Multiple Vitamin (MULTIVITAMIN WITH MINERALS) TABS tablet Take 1 tablet by mouth daily.  . Omega-3 1000 MG CAPS Take 1 capsule (1,000 mg total) by mouth daily.    PHQ 2/9 Scores 09/13/2020 09/06/2020 09/03/2019 04/21/2019  PHQ - 2 Score 0 0 0 0  PHQ- 9 Score 0 0 0 0    GAD  7 : Generalized Anxiety Score 09/13/2020 09/06/2020 09/03/2019 04/21/2019  Nervous, Anxious, on Edge 0 0 0 0  Control/stop worrying 0 0 0 0  Worry too much - different things 0 0 0 0  Trouble relaxing 0 0 0 0  Restless 0 0 0 0  Easily annoyed or irritable 0 0 0 0  Afraid - awful might happen 0 0 0 0  Total GAD 7 Score 0 0 0 0  Anxiety Difficulty - Not difficult at all - -    BP Readings from Last 3 Encounters:  09/26/20 132/80  09/13/20 130/70  09/06/20 130/70    Physical Exam HENT:     Head: Normocephalic.     Right Ear: Tympanic membrane and external ear normal. No decreased hearing noted.     Left Ear: Tympanic membrane and external ear normal. No  decreased hearing noted.     Nose: Congestion present. No nasal tenderness or rhinorrhea.     Right Turbinates: Swollen.     Left Turbinates: Swollen.     Right Sinus: No maxillary sinus tenderness or frontal sinus tenderness.     Left Sinus: Maxillary sinus tenderness present. No frontal sinus tenderness.     Mouth/Throat:     Lips: Pink.     Mouth: Mucous membranes are moist.     Dentition: Normal dentition.  Eyes:     General: No scleral icterus.       Right eye: No discharge.        Left eye: No discharge.     Conjunctiva/sclera: Conjunctivae normal.     Pupils: Pupils are equal, round, and reactive to light.  Neck:     Thyroid: No thyromegaly.     Vascular: No JVD.     Trachea: No tracheal deviation.  Cardiovascular:     Rate and Rhythm: Normal rate and regular rhythm.     Heart sounds: Normal heart sounds. No murmur heard. No friction rub. No gallop.   Pulmonary:     Effort: Pulmonary effort is normal. No respiratory distress.     Breath sounds: Normal breath sounds. No decreased breath sounds, wheezing, rhonchi or rales.  Abdominal:     General: Bowel sounds are normal.     Palpations: Abdomen is soft. There is no mass.     Tenderness: There is no abdominal tenderness. There is no guarding or rebound.  Musculoskeletal:        General: No tenderness. Normal range of motion.     Cervical back: Normal range of motion and neck supple.  Lymphadenopathy:     Cervical: No cervical adenopathy.  Skin:    General: Skin is warm.     Findings: No rash.  Neurological:     Mental Status: He is alert and oriented to person, place, and time.     Cranial Nerves: No cranial nerve deficit.     Deep Tendon Reflexes: Reflexes are normal and symmetric.     Wt Readings from Last 3 Encounters:  09/26/20 173 lb (78.5 kg)  09/13/20 173 lb (78.5 kg)  09/06/20 176 lb (79.8 kg)    BP 132/80   Pulse 88   Temp 98.3 F (36.8 C) (Oral)   Ht 5\' 7"  (1.702 m)   Wt 173 lb (78.5 kg)   SpO2  99%   BMI 27.10 kg/m   Assessment and Plan:   1. Acute recurrent maxillary sinusitis Acute.  Perhaps recurrent.  Recently in April 19 treated a sinus infection with azithromycin.  Reactivity with rhinorrhea, left maxillary congestion, and tenderness over the left maxillary sinus.  We will initiate Augmentin 875 mg 1 twice a day and in addition to Sudafed patient needs to take Singulair 10 mg once a day, Nasonex 2 sprays in nostril daily and Nettie pot/saline nasal spray lavage.  We will obtain a sinus view x-ray to determine if there is a chronic sinusitis that is not being adequately treated with oral antibiotic. - amoxicillin-clavulanate (AUGMENTIN) 875-125 MG tablet; Take 1 tablet by mouth 2 (two) times daily.  Dispense: 20 tablet; Refill: 0 - montelukast (SINGULAIR) 10 MG tablet; Take 1 tablet (10 mg total) by mouth at bedtime.  Dispense: 30 tablet; Refill: 3 - mometasone (NASONEX) 50 MCG/ACT nasal spray; Place 2 sprays into the nose daily.  Dispense: 1 each; Refill: 12 - DG Sinuses Complete; Future

## 2020-09-26 NOTE — Patient Instructions (Signed)
Sinusitis, Adult Sinusitis is inflammation of your sinuses. Sinuses are hollow spaces in the bones around your face. Your sinuses are located:  Around your eyes.  In the middle of your forehead.  Behind your nose.  In your cheekbones. Mucus normally drains out of your sinuses. When your nasal tissues become inflamed or swollen, mucus can become trapped or blocked. This allows bacteria, viruses, and fungi to grow, which leads to infection. Most infections of the sinuses are caused by a virus. Sinusitis can develop quickly. It can last for up to 4 weeks (acute) or for more than 12 weeks (chronic). Sinusitis often develops after a cold. What are the causes? This condition is caused by anything that creates swelling in the sinuses or stops mucus from draining. This includes:  Allergies.  Asthma.  Infection from bacteria or viruses.  Deformities or blockages in your nose or sinuses.  Abnormal growths in the nose (nasal polyps).  Pollutants, such as chemicals or irritants in the air.  Infection from fungi (rare). What increases the risk? You are more likely to develop this condition if you:  Have a weak body defense system (immune system).  Do a lot of swimming or diving.  Overuse nasal sprays.  Smoke. What are the signs or symptoms? The main symptoms of this condition are pain and a feeling of pressure around the affected sinuses. Other symptoms include:  Stuffy nose or congestion.  Thick drainage from your nose.  Swelling and warmth over the affected sinuses.  Headache.  Upper toothache.  A cough that may get worse at night.  Extra mucus that collects in the throat or the back of the nose (postnasal drip).  Decreased sense of smell and taste.  Fatigue.  A fever.  Sore throat.  Bad breath. How is this diagnosed? This condition is diagnosed based on:  Your symptoms.  Your medical history.  A physical exam.  Tests to find out if your condition is  acute or chronic. This may include: ? Checking your nose for nasal polyps. ? Viewing your sinuses using a device that has a light (endoscope). ? Testing for allergies or bacteria. ? Imaging tests, such as an MRI or CT scan. In rare cases, a bone biopsy may be done to rule out more serious types of fungal sinus disease. How is this treated? Treatment for sinusitis depends on the cause and whether your condition is chronic or acute.  If caused by a virus, your symptoms should go away on their own within 10 days. You may be given medicines to relieve symptoms. They include: ? Medicines that shrink swollen nasal passages (topical intranasal decongestants). ? Medicines that treat allergies (antihistamines). ? A spray that eases inflammation of the nostrils (topical intranasal corticosteroids). ? Rinses that help get rid of thick mucus in your nose (nasal saline washes).  If caused by bacteria, your health care provider may recommend waiting to see if your symptoms improve. Most bacterial infections will get better without antibiotic medicine. You may be given antibiotics if you have: ? A severe infection. ? A weak immune system.  If caused by narrow nasal passages or nasal polyps, you may need to have surgery. Follow these instructions at home: Medicines  Take, use, or apply over-the-counter and prescription medicines only as told by your health care provider. These may include nasal sprays.  If you were prescribed an antibiotic medicine, take it as told by your health care provider. Do not stop taking the antibiotic even if you start   to feel better. Hydrate and humidify  Drink enough fluid to keep your urine pale yellow. Staying hydrated will help to thin your mucus.  Use a cool mist humidifier to keep the humidity level in your home above 50%.  Inhale steam for 10-15 minutes, 3-4 times a day, or as told by your health care provider. You can do this in the bathroom while a hot shower is  running.  Limit your exposure to cool or dry air.   Rest  Rest as much as possible.  Sleep with your head raised (elevated).  Make sure you get enough sleep each night. General instructions  Apply a warm, moist washcloth to your face 3-4 times a day or as told by your health care provider. This will help with discomfort.  Wash your hands often with soap and water to reduce your exposure to germs. If soap and water are not available, use hand sanitizer.  Do not smoke. Avoid being around people who are smoking (secondhand smoke).  Keep all follow-up visits as told by your health care provider. This is important.   Contact a health care provider if:  You have a fever.  Your symptoms get worse.  Your symptoms do not improve within 10 days. Get help right away if:  You have a severe headache.  You have persistent vomiting.  You have severe pain or swelling around your face or eyes.  You have vision problems.  You develop confusion.  Your neck is stiff.  You have trouble breathing. Summary  Sinusitis is soreness and inflammation of your sinuses. Sinuses are hollow spaces in the bones around your face.  This condition is caused by nasal tissues that become inflamed or swollen. The swelling traps or blocks the flow of mucus. This allows bacteria, viruses, and fungi to grow, which leads to infection.  If you were prescribed an antibiotic medicine, take it as told by your health care provider. Do not stop taking the antibiotic even if you start to feel better.  Keep all follow-up visits as told by your health care provider. This is important. This information is not intended to replace advice given to you by your health care provider. Make sure you discuss any questions you have with your health care provider. Document Revised: 10/07/2017 Document Reviewed: 10/07/2017 Elsevier Patient Education  2021 Elsevier Inc.  

## 2021-01-13 ENCOUNTER — Encounter: Payer: Self-pay | Admitting: Family Medicine

## 2021-01-13 ENCOUNTER — Ambulatory Visit (INDEPENDENT_AMBULATORY_CARE_PROVIDER_SITE_OTHER): Payer: No Typology Code available for payment source | Admitting: Family Medicine

## 2021-01-13 ENCOUNTER — Other Ambulatory Visit: Payer: Self-pay

## 2021-01-13 VITALS — BP 120/80 | HR 80 | Ht 67.0 in | Wt 167.0 lb

## 2021-01-13 DIAGNOSIS — E785 Hyperlipidemia, unspecified: Secondary | ICD-10-CM | POA: Diagnosis not present

## 2021-01-13 DIAGNOSIS — B349 Viral infection, unspecified: Secondary | ICD-10-CM | POA: Diagnosis not present

## 2021-01-13 DIAGNOSIS — J301 Allergic rhinitis due to pollen: Secondary | ICD-10-CM | POA: Diagnosis not present

## 2021-01-13 DIAGNOSIS — I10 Essential (primary) hypertension: Secondary | ICD-10-CM | POA: Diagnosis not present

## 2021-01-13 MED ORDER — ACYCLOVIR 200 MG PO CAPS: 200.0000 mg | ORAL_CAPSULE | Freq: Every day | ORAL | 1 refills | Status: DC

## 2021-01-13 MED ORDER — MONTELUKAST SODIUM 10 MG PO TABS: 10.0000 mg | ORAL_TABLET | Freq: Every day | ORAL | 3 refills | Status: DC

## 2021-01-13 MED ORDER — METOPROLOL SUCCINATE ER 25 MG PO TB24: 25.0000 mg | ORAL_TABLET | Freq: Every day | ORAL | 1 refills | Status: DC

## 2021-01-13 MED ORDER — MOMETASONE FUROATE 50 MCG/ACT NA SUSP: 2.0000 | Freq: Every day | NASAL | 12 refills | Status: DC

## 2021-01-13 MED ORDER — LOSARTAN POTASSIUM 100 MG PO TABS: 100.0000 mg | ORAL_TABLET | Freq: Every day | ORAL | 0 refills | Status: DC

## 2021-01-13 NOTE — Progress Notes (Signed)
Date:  01/13/2021   Name:  Matthew Zuniga.   DOB:  [birth year]   MRN:  [id number]   Chief Complaint: Hypertension, Allergic Rhinitis , and viral syndrome  Hypertension This is a chronic problem. The current episode started more than 1 year ago. The problem has been gradually improving since onset. The problem is controlled. Pertinent negatives include no anxiety, blurred vision, headaches, malaise/fatigue, neck pain, orthopnea, palpitations, peripheral edema, PND, shortness of breath or sweats. There are no associated agents to hypertension. Past treatments include angiotensin blockers and beta blockers. The current treatment provides moderate improvement. There are no compliance problems.  There is no history of angina, kidney disease, CAD/MI, CVA, heart failure, left ventricular hypertrophy, PVD or retinopathy. There is no history of chronic renal disease, a hypertension causing med or renovascular disease.  URI  Chronicity: for allergic rhinitis. The current episode started more than 1 year ago. The problem has been gradually improving. Pertinent negatives include no headaches, neck pain, sinus pain or sneezing. The treatment provided moderate relief.   Lab Results  Component Value Date   CREATININE 0.83 09/13/2020   BUN 9 09/13/2020   NA 143 09/13/2020   K 4.4 09/13/2020   CL 101 09/13/2020   CO2 25 09/13/2020   Lab Results  Component Value Date   CHOL 234 (H) 09/13/2020   HDL 87 09/13/2020   LDLCALC 121 (H) 09/13/2020   TRIG 153 (H) 09/13/2020   CHOLHDL 2.4 02/06/2019   No results found for: TSH No results found for: HGBA1C Lab Results  Component Value Date   WBC 5.9 11/01/2014   HGB 14.3 11/01/2014   HCT 41.1 11/01/2014   MCV 98 (H) 11/01/2014   PLT 242 11/01/2014   Lab Results  Component Value Date   ALT 33 11/01/2014   AST 30 11/01/2014   ALKPHOS 67 11/01/2014   BILITOT 0.4 11/01/2014     Review of Systems  Constitutional:  Negative for malaise/fatigue.   HENT:  Negative for sinus pain and sneezing.   Eyes:  Negative for blurred vision.  Respiratory:  Negative for shortness of breath.   Cardiovascular:  Negative for palpitations, orthopnea and PND.  Musculoskeletal:  Negative for neck pain.  Neurological:  Negative for headaches.   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  Social History   Tobacco Use   Smoking status: Never   Smokeless tobacco: Current    Types: Snuff  Substance Use Topics   Alcohol use: Yes    Alcohol/week: 6.0 standard drinks    Types: 6 Cans of beer per week    Comment: daily   Drug use: Never     Medication list has been reviewed and updated.  Current Meds  Medication Sig   acyclovir (ZOVIRAX) 200 MG capsule Take 1 capsule (200 mg total) by mouth daily.   COLLAGEN PO Take 4,000 mg by mouth daily.   esomeprazole (NEXIUM) 40 MG capsule Take 1 capsule (40 mg total) by mouth daily at 12 noon. otc   losartan (COZAAR) 100 MG tablet Take 1 tablet (100 mg total) by mouth daily.   metoprolol succinate (TOPROL-XL) 25 MG 24 hr tablet Take 1 tablet (25  mg total) by mouth daily.   milk thistle 175 MG tablet Take 175 mg by mouth daily.   mometasone (NASONEX) 50 MCG/ACT nasal spray Place 2 sprays into the nose daily.   montelukast (SINGULAIR) 10 MG tablet Take 1 tablet (10 mg total) by mouth at bedtime.   Multiple Vitamin (MULTIVITAMIN WITH MINERALS) TABS tablet Take 1 tablet by mouth daily.   Omega-3 1000 MG CAPS Take 1 capsule (1,000 mg total) by mouth daily.    PHQ 2/9 Scores 09/13/2020 09/06/2020 09/03/2019 04/21/2019  PHQ - 2 Score 0 0 0 0  PHQ- 9 Score 0 0 0 0    GAD 7 : Generalized Anxiety Score 09/13/2020  09/06/2020 09/03/2019 04/21/2019  Nervous, Anxious, on Edge 0 0 0 0  Control/stop worrying 0 0 0 0  Worry too much - different things 0 0 0 0  Trouble relaxing 0 0 0 0  Restless 0 0 0 0  Easily annoyed or irritable 0 0 0 0  Afraid - awful might happen 0 0 0 0  Total GAD 7 Score 0 0 0 0  Anxiety Difficulty - Not difficult at all - -    BP Readings from Last 3 Encounters:  01/13/21 120/80  09/26/20 132/80  09/13/20 130/70    Physical Exam  Wt Readings from Last 3 Encounters:  01/13/21 167 lb (75.8 kg)  09/26/20 173 lb (78.5 kg)  09/13/20 173 lb (78.5 kg)    BP 120/80   Pulse 80   Ht '5\' 7"'$  (1.702 m)   Wt 167 lb (75.8 kg)   BMI 26.16 kg/m   Assessment and Plan:  1. Essential hypertension Chronic.  Controlled.  Stable.  Continue losartan 100 mg once a day and metoprolol XL 25 mg once a day.  Will check renal function panel with GFR and electrolyte status. - losartan (COZAAR) 100 MG tablet; Take 1 tablet (100 mg total) by mouth daily.  Dispense: 90 tablet; Refill: 0 - metoprolol succinate (TOPROL-XL) 25 MG 24 hr tablet; Take 1 tablet (25 mg total) by mouth daily.  Dispense: 90 tablet; Refill: 1 - Renal Function Panel  2. Viral syndrome .  Controlled.  Stable.  Continue acyclovir 200 mg once a day. - acyclovir (ZOVIRAX) 200 MG capsule; Take 1 capsule (200 mg total) by mouth daily.  Dispense: 90 capsule; Refill: 1  3. Seasonal allergic rhinitis due to pollen Chronic.  Controlled.  Stable.  Continue Singulair and Nasonex for control of allergic rhinitis symptoms.  4. Hyperlipidemia, unspecified hyperlipidemia type Chronic.  Controlled.  Stable.  Will check lipid panel for current status of LDL.  In the meantime we will continue omega-3 1000 mg capsule 1 capsule daily. - Lipid Panel With LDL/HDL Ratio - mometasone (NASONEX) 50 MCG/ACT nasal spray; Place 2 sprays into the nose daily.  Dispense: 1 each; Refill: 12 - montelukast (SINGULAIR) 10 MG tablet; Take 1 tablet (10 mg  total) by mouth at bedtime.  Dispense: 30 tablet; Refill: 3

## 2021-01-14 LAB — RENAL FUNCTION PANEL
Albumin: 5 g/dL (ref 4.0–5.0)
BUN/Creatinine Ratio: 17 (ref 9–20)
BUN: 13 mg/dL (ref 6–24)
CO2: 24 mmol/L (ref 20–29)
Calcium: 9.6 mg/dL (ref 8.7–10.2)
Chloride: 94 mmol/L — ABNORMAL LOW (ref 96–106)
Creatinine, Ser: 0.75 mg/dL — ABNORMAL LOW (ref 0.76–1.27)
Glucose: 99 mg/dL (ref 65–99)
Phosphorus: 3.8 mg/dL (ref 2.8–4.1)
Potassium: 4.6 mmol/L (ref 3.5–5.2)
Sodium: 134 mmol/L (ref 134–144)
eGFR: 112 mL/min/{1.73_m2} (ref >59)

## 2021-01-14 LAB — LIPID PANEL WITH LDL/HDL RATIO
Cholesterol, Total: 246 mg/dL — ABNORMAL HIGH (ref 100–199)
HDL: 118 mg/dL (ref >39)
LDL Chol Calc (NIH): 119 mg/dL — ABNORMAL HIGH (ref 0–99)
LDL/HDL Ratio: 1 ratio (ref 0.0–3.6)
Triglycerides: 54 mg/dL (ref 0–149)
VLDL Cholesterol Cal: 9 mg/dL (ref 5–40)

## 2021-03-03 ENCOUNTER — Ambulatory Visit: Payer: No Typology Code available for payment source | Admitting: Family Medicine

## 2021-04-06 ENCOUNTER — Other Ambulatory Visit: Payer: Self-pay | Admitting: Family Medicine

## 2021-04-06 DIAGNOSIS — I10 Essential (primary) hypertension: Secondary | ICD-10-CM

## 2021-04-06 MED ORDER — LOSARTAN POTASSIUM 100 MG PO TABS: 100.0000 mg | ORAL_TABLET | Freq: Every day | ORAL | 0 refills | Status: DC

## 2021-04-06 NOTE — Telephone Encounter (Signed)
Requested Prescriptions  Pending Prescriptions Disp Refills  . losartan (COZAAR) 100 MG tablet 90 tablet 0    Sig: Take 1 tablet (100 mg total) by mouth daily.     Cardiovascular:  Angiotensin Receptor Blockers Failed - 04/06/2021 11:59 AM      Failed - Cr in normal range and within 180 days    Creatinine, Ser  Date Value Ref Range Status  01/13/2021 0.75 (L) 0.76 - 1.27 mg/dL Final         Passed - K in normal range and within 180 days    Potassium  Date Value Ref Range Status  01/13/2021 4.6 3.5 - 5.2 mmol/L Final         Passed - Patient is not pregnant      Passed - Last BP in normal range    BP Readings from Last 1 Encounters:  01/13/21 120/80         Passed - Valid encounter within last 6 months    Recent Outpatient Visits          2 months ago Essential hypertension   [place], [place]   6 months ago Acute recurrent maxillary sinusitis   [facility] Juline Patch, MD   6 months ago Essential hypertension   [place], MD   7 months ago Acute non-recurrent maxillary sinusitis   [facility] Juline Patch, MD   9 months ago Essential hypertension   [place], Deanna C, MD      Future Appointments            In 3 months Juline Patch, MD [facility], [facility]

## 2021-04-06 NOTE — Telephone Encounter (Signed)
Medication Refill - Medication: Losartin 100 mg    Has the patient contacted their pharmacy? Yes.  They tol him he has no more refills (Agent: If no, request that the patient contact the pharmacy for the refill. If patient does not wish to contact the pharmacy document the reason why and proceed with request.) (Agent: If yes, when and what did the pharmacy advise?)  Preferred Pharmacy (with phone number or street name): [facility] [place] Has the patient been seen for an appointment in the last year OR does the patient have an upcoming appointment? Yes.    Agent: Please be advised that RX refills may take up to 3 business days. We ask that you follow-up with your pharmacy.

## 2021-07-10 ENCOUNTER — Other Ambulatory Visit: Payer: Self-pay | Admitting: Family Medicine

## 2021-07-10 DIAGNOSIS — I10 Essential (primary) hypertension: Secondary | ICD-10-CM

## 2021-07-11 NOTE — Telephone Encounter (Signed)
Requested Prescriptions  Pending Prescriptions Disp Refills   losartan (COZAAR) 100 MG tablet [Pharmacy Med Name: Losartan Potassium 100 MG Oral Tablet] 90 tablet 0    Sig: Take 1 tablet by mouth once daily     Cardiovascular:  Angiotensin Receptor Blockers Failed - 07/10/2021  7:00 PM      Failed - Cr in normal range and within 180 days    Creatinine, Ser  Date Value Ref Range Status  01/13/2021 0.75 (L) 0.76 - 1.27 mg/dL Final         Passed - K in normal range and within 180 days    Potassium  Date Value Ref Range Status  01/13/2021 4.6 3.5 - 5.2 mmol/L Final         Passed - Patient is not pregnant      Passed - Last BP in normal range    BP Readings from Last 1 Encounters:  01/13/21 120/80         Passed - Valid encounter within last 6 months    Recent Outpatient Visits          5 months ago Essential hypertension   [place], Deanna C, MD   9 months ago Acute recurrent maxillary sinusitis   [facility] Juline Patch, MD   10 months ago Essential hypertension   [place], MD   10 months ago Acute non-recurrent maxillary sinusitis   [facility] Juline Patch, MD   1 year ago Essential hypertension   [place], Deanna C, MD      Future Appointments            In 6 days Juline Patch, MD [facility], [facility]

## 2021-07-17 ENCOUNTER — Ambulatory Visit: Payer: No Typology Code available for payment source | Admitting: Family Medicine

## 2021-07-17 ENCOUNTER — Other Ambulatory Visit: Payer: Self-pay

## 2021-07-17 ENCOUNTER — Encounter: Payer: Self-pay | Admitting: Family Medicine

## 2021-07-17 VITALS — BP 120/80 | HR 72 | Ht 67.0 in | Wt 172.0 lb

## 2021-07-17 DIAGNOSIS — E785 Hyperlipidemia, unspecified: Secondary | ICD-10-CM

## 2021-07-17 DIAGNOSIS — Z1211 Encounter for screening for malignant neoplasm of colon: Secondary | ICD-10-CM

## 2021-07-17 DIAGNOSIS — J301 Allergic rhinitis due to pollen: Secondary | ICD-10-CM | POA: Diagnosis not present

## 2021-07-17 DIAGNOSIS — I10 Essential (primary) hypertension: Secondary | ICD-10-CM

## 2021-07-17 DIAGNOSIS — B349 Viral infection, unspecified: Secondary | ICD-10-CM

## 2021-07-17 MED ORDER — MONTELUKAST SODIUM 10 MG PO TABS: 10.0000 mg | ORAL_TABLET | Freq: Every day | ORAL | 3 refills | Status: DC

## 2021-07-17 MED ORDER — ACYCLOVIR 200 MG PO CAPS: 200.0000 mg | ORAL_CAPSULE | Freq: Every day | ORAL | 1 refills | Status: DC

## 2021-07-17 MED ORDER — LOSARTAN POTASSIUM 100 MG PO TABS: 100.0000 mg | ORAL_TABLET | Freq: Every day | ORAL | 1 refills | Status: DC

## 2021-07-17 MED ORDER — METOPROLOL SUCCINATE ER 25 MG PO TB24: 25.0000 mg | ORAL_TABLET | Freq: Every day | ORAL | 1 refills | Status: DC

## 2021-07-17 MED ORDER — NA SULFATE-K SULFATE-MG SULF 17.5-3.13-1.6 GM/177ML PO SOLN: 1.0000 | Freq: Once | ORAL | 0 refills | Status: AC

## 2021-07-17 NOTE — Progress Notes (Signed)
Date:  07/17/2021   Name:  Matthew Zuniga.   DOB:  1970-07-16   MRN:  [id number]   Chief Complaint: Allergic Rhinitis , Hypertension, and viral syndrome  Hypertension Pertinent negatives include no chest pain, headaches, neck pain, palpitations or shortness of breath.   Lab Results  Component Value Date   NA 134 01/13/2021   K 4.6 01/13/2021   CO2 24 01/13/2021   GLUCOSE 99 01/13/2021   BUN 13 01/13/2021   CREATININE 0.75 (L) 01/13/2021   CALCIUM 9.6 01/13/2021   EGFR 112 01/13/2021   GFRNONAA 103 02/06/2019   Lab Results  Component Value Date   CHOL 246 (H) 01/13/2021   HDL 118 01/13/2021   LDLCALC 119 (H) 01/13/2021   TRIG 54 01/13/2021   CHOLHDL 2.4 02/06/2019   No results found for: TSH No results found for: HGBA1C Lab Results  Component Value Date   WBC 5.9 11/01/2014   HGB 14.3 11/01/2014   HCT 41.1 11/01/2014   MCV 98 (H) 11/01/2014   PLT 242 11/01/2014   Lab Results  Component Value Date   ALT 33 11/01/2014   AST 30 11/01/2014   ALKPHOS 67 11/01/2014   BILITOT 0.4 11/01/2014   No results found for: 25OHVITD2, 25OHVITD3, VD25OH   Review of Systems  Constitutional:  Negative for chills and fever.  HENT:  Negative for drooling, ear discharge, ear pain and sore throat.   Respiratory:  Negative for cough, shortness of breath and wheezing.   Cardiovascular:  Negative for chest pain, palpitations and leg swelling.  Gastrointestinal:  Negative for abdominal pain, blood in stool, constipation, diarrhea and nausea.  Endocrine: Negative for polydipsia.  Genitourinary:  Negative for dysuria, frequency, hematuria and urgency.  Musculoskeletal:  Negative for back pain, myalgias and neck pain.  Skin:  Negative for rash.  Allergic/Immunologic: Negative for environmental allergies.  Neurological:  Negative for dizziness and headaches.  Hematological:  Does not bruise/bleed easily.  Psychiatric/Behavioral:  Negative for suicidal ideas. The patient is not  nervous/anxious.    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  Social History   Tobacco Use   Smoking status: Never   Smokeless tobacco: Current    Types: Snuff  Substance Use Topics   Alcohol use: Yes    Alcohol/week: 6.0 standard drinks    Types: 6 Cans of beer per week    Comment: daily   Drug use: Never     Medication list has been reviewed and updated.  Current Meds  Medication Sig   acyclovir (ZOVIRAX) 200 MG capsule Take 1 capsule (200 mg total) by mouth daily.   COLLAGEN PO Take 4,000 mg by mouth daily.   esomeprazole (NEXIUM) 40 MG capsule Take 1 capsule (40 mg total) by mouth daily at 12 noon. otc   losartan (COZAAR) 100 MG tablet Take 1 tablet by mouth once daily   metoprolol succinate (TOPROL-XL) 25 MG 24 hr tablet Take 1 tablet (25 mg total) by mouth daily.   milk thistle 175 MG tablet Take 175 mg by mouth daily.   montelukast (SINGULAIR) 10 MG tablet Take 1 tablet (10 mg total) by mouth at bedtime.   Multiple Vitamin (MULTIVITAMIN  WITH MINERALS) TABS tablet Take 1 tablet by mouth daily.   Omega-3 1000 MG CAPS Take 1 capsule (1,000 mg total) by mouth daily.    PHQ 2/9 Scores 07/17/2021 09/13/2020 09/06/2020 09/03/2019  PHQ - 2 Score 0 0 0 0  PHQ- 9 Score 0 0 0 0    GAD 7 : Generalized Anxiety Score 07/17/2021 09/13/2020 09/06/2020 09/03/2019  Nervous, Anxious, on Edge 0 0 0 0  Control/stop worrying 0 0 0 0  Worry too much - different things 0 0 0 0  Trouble relaxing 0 0 0 0  Restless 0 0 0 0  Easily annoyed or irritable 0 0 0 0  Afraid - awful might happen 0 0 0 0  Total GAD 7 Score 0 0 0 0  Anxiety Difficulty Not difficult at all - Not difficult at all -    BP  Readings from Last 3 Encounters:  07/17/21 120/80  01/13/21 120/80  09/26/20 132/80    Physical Exam Vitals and nursing note reviewed.  HENT:     Head: Normocephalic.     Right Ear: External ear normal.     Left Ear: External ear normal.     Nose: Nose normal.  Eyes:     General: No scleral icterus.       Right eye: No discharge.        Left eye: No discharge.     Conjunctiva/sclera: Conjunctivae normal.     Pupils: Pupils are equal, round, and reactive to light.  Neck:     Thyroid: No thyromegaly.     Vascular: No JVD.     Trachea: No tracheal deviation.  Cardiovascular:     Rate and Rhythm: Normal rate and regular rhythm.     Heart sounds: Normal heart sounds and S1 normal. No murmur heard. No systolic murmur is present.  No diastolic murmur is present.    No friction rub. No gallop. No S3 or S4 sounds.  Pulmonary:     Effort: No respiratory distress.     Breath sounds: Normal breath sounds. No decreased breath sounds, wheezing, rhonchi or rales.  Abdominal:     General: Bowel sounds are normal.     Palpations: Abdomen is soft. There is no mass.     Tenderness: There is no abdominal tenderness. There is no guarding or rebound.  Musculoskeletal:        General: No tenderness. Normal range of motion.     Cervical back: Normal range of motion and neck supple.  Lymphadenopathy:     Cervical: No cervical adenopathy.  Skin:    General: Skin is warm.     Findings: No rash.  Neurological:     Mental Status: He is alert and oriented to person, place, and time.     Cranial Nerves: No cranial nerve deficit.     Deep Tendon Reflexes: Reflexes are normal and symmetric.    Wt Readings from Last 3 Encounters:  07/17/21 172 lb (78 kg)  01/13/21 167 lb (75.8 kg)  09/26/20 173 lb (78.5 kg)    BP 120/80    Pulse 72    Ht _0  (1.702 m)    Wt 172 lb (78 kg)    BMI 26.94 kg/m   Assessment and Plan:  1. Essential hypertension Chronic.  Controlled.  Stable.  Blood pressure  today is 120/80.  Continue losartan 100 mg once a day and metoprolol XL 25 mg once a day. - losartan (COZAAR) 100 MG tablet; Take  1 tablet (100 mg total) by mouth daily.  Dispense: 90 tablet; Refill: 1 - metoprolol succinate (TOPROL-XL) 25 MG 24 hr tablet; Take 1 tablet (25 mg total) by mouth daily.  Dispense: 90 tablet; Refill: 1  2. Viral syndrome Chronic.  Recurrent.  Currently stable.  A as needed basis for suppression patient takes acyclovir 200 mg once a day. - acyclovir (ZOVIRAX) 200 MG capsule; Take 1 capsule (200 mg total) by mouth daily.  Dispense: 90 capsule; Refill: 1  3. Hyperlipidemia, unspecified hyperlipidemia type Chronic.  Controlled.  Stable.  Currently controlling with diet.  We will continue to do so. - montelukast (SINGULAIR) 10 MG tablet; Take 1 tablet (10 mg total) by mouth at bedtime.  Dispense: 30 tablet; Refill: 3  4. Seasonal allergic rhinitis due to pollen Chronic.  Controlled.  Stable.  Currently controlled on Singulair 10 mg once a day.  As well as over-the-counter antihistamines on as-needed basis.  5. Colon cancer screening This is discussed with patient and referral to gastroenterology for screening colonoscopy.- Ambulatory referral to Gastroenterology

## 2021-07-17 NOTE — Progress Notes (Signed)
Gastroenterology Pre-Procedure Review  Request Date: 08/17/2021 Requesting Physician: Dr. Allen Norris  PATIENT REVIEW QUESTIONS: The patient responded to the following health history questions as indicated:    1. Are you having any GI issues? no 2. Do you have a personal history of Polyps? no 3. Do you have a family history of Colon Cancer or Polyps? no 4. Diabetes Mellitus? no 5. Joint replacements in the past 12 months?no 6. Major health problems in the past 3 months?no 7. Any artificial heart valves, MVP, or defibrillator?no    MEDICATIONS & ALLERGIES:    Patient reports the following regarding taking any anticoagulation/antiplatelet therapy:   Plavix, Coumadin, Eliquis, Xarelto, Lovenox, Pradaxa, Brilinta, or Effient? no Aspirin? no  Patient confirms/reports the following medications:  Current Outpatient Medications  Medication Sig Dispense Refill   acyclovir (ZOVIRAX) 200 MG capsule Take 1 capsule (200 mg total) by mouth daily. 90 capsule 1   COLLAGEN PO Take 4,000 mg by mouth daily.     esomeprazole (NEXIUM) 40 MG capsule Take 1 capsule (40 mg total) by mouth daily at 12 noon. otc 30 capsule 5   losartan (COZAAR) 100 MG tablet Take 1 tablet (100 mg total) by mouth daily. 90 tablet 1   metoprolol succinate (TOPROL-XL) 25 MG 24 hr tablet Take 1 tablet (25 mg total) by mouth daily. 90 tablet 1   milk thistle 175 MG tablet Take 175 mg by mouth daily.     montelukast (SINGULAIR) 10 MG tablet Take 1 tablet (10 mg total) by mouth at bedtime. 30 tablet 3   Multiple Vitamin (MULTIVITAMIN WITH MINERALS) TABS tablet Take 1 tablet by mouth daily.     Omega-3 1000 MG CAPS Take 1 capsule (1,000 mg total) by mouth daily. 90 capsule 3   No current facility-administered medications for this visit.    Patient confirms/reports the following allergies:  No Known Allergies  No orders of the defined types were placed in this encounter.   AUTHORIZATION INFORMATION Primary Insurance: 1D#: Group  #:  Secondary Insurance: 1D#: Group #:  SCHEDULE INFORMATION: Date: 08/17/2021 Time: Location:[facility]

## 2021-07-20 ENCOUNTER — Telehealth: Payer: Self-pay | Admitting: Family Medicine

## 2021-07-20 NOTE — Telephone Encounter (Signed)
Copied from [place] [id number]. Topic: General - Inquiry ?>> Jul 20, 2021  3:04 PM Alanda Slim E wrote: ?Reason for CRM: Pts spouse called about the colonoscopy that was scheduled she was advised by the pt to call about an Pre approval or authorization through insurance to get this done through [facility]/ please advise ?

## 2021-07-28 ENCOUNTER — Encounter: Payer: Self-pay | Admitting: Family Medicine

## 2021-07-28 ENCOUNTER — Ambulatory Visit: Payer: No Typology Code available for payment source | Admitting: Family Medicine

## 2021-07-28 ENCOUNTER — Other Ambulatory Visit: Payer: Self-pay

## 2021-07-28 VITALS — BP 120/88 | HR 100 | Temp 98.7°F | Ht 67.0 in | Wt 179.0 lb

## 2021-07-28 DIAGNOSIS — J0101 Acute recurrent maxillary sinusitis: Secondary | ICD-10-CM

## 2021-07-28 DIAGNOSIS — R509 Fever, unspecified: Secondary | ICD-10-CM | POA: Diagnosis not present

## 2021-07-28 DIAGNOSIS — J029 Acute pharyngitis, unspecified: Secondary | ICD-10-CM | POA: Diagnosis not present

## 2021-07-28 LAB — POCT RAPID STREP A (OFFICE): Rapid Strep A Screen: NEGATIVE

## 2021-07-28 LAB — POC COVID19 BINAXNOW: SARS Coronavirus 2 Ag: NEGATIVE

## 2021-07-28 MED ORDER — AMOXICILLIN 500 MG PO CAPS: 500.0000 mg | ORAL_CAPSULE | Freq: Three times a day (TID) | ORAL | 0 refills | Status: AC

## 2021-07-28 NOTE — Progress Notes (Signed)
[facility] ? ? ?Date:  07/28/2021  ? ?Name:  Matthew Zuniga.   DOB:  1970-12-07   MRN:  [id number] ? ? ?Chief Complaint: Sore Throat (R) side of throat and low grade fever x 1 day) ? ?Sore Throat  ?This is a new problem. The current episode started yesterday. The problem has been gradually worsening. The pain is worse on the right side. Maximum temperature: low grade. The pain is moderate. Associated symptoms include headaches. Pertinent negatives include no abdominal pain, congestion, coughing, diarrhea, drooling, ear discharge, ear pain, hoarse voice, plugged ear sensation, neck pain, shortness of breath, stridor, swollen glands, trouble swallowing or vomiting. Associated symptoms comments: frontal. He has had no exposure to strep or mono. He has tried nothing for the symptoms. The treatment provided moderate relief.  ? ?Lab Results  ?Component Value Date  ? NA 134 01/13/2021  ? K 4.6 01/13/2021  ? CO2 24 01/13/2021  ? GLUCOSE 99 01/13/2021  ? BUN 13 01/13/2021  ? CREATININE 0.75 (L) 01/13/2021  ? CALCIUM 9.6 01/13/2021  ? EGFR 112 01/13/2021  ? GFRNONAA 103 02/06/2019  ? ?Lab Results  ?Component Value Date  ? CHOL 246 (H) 01/13/2021  ? HDL 118 01/13/2021  ? LDLCALC 119 (H) 01/13/2021  ? TRIG 54 01/13/2021  ? CHOLHDL 2.4 02/06/2019  ? ?No results found for: TSH ?No results found for: HGBA1C ?Lab Results  ?Component Value Date  ? WBC 5.9 11/01/2014  ? HGB 14.3 11/01/2014  ? HCT 41.1 11/01/2014  ? MCV 98 (H) 11/01/2014  ? PLT 242 11/01/2014  ? ?Lab Results  ?Component Value Date  ? ALT 33 11/01/2014  ? AST 30 11/01/2014  ? ALKPHOS 67 11/01/2014  ? BILITOT 0.4 11/01/2014  ? ?No results found for: 25OHVITD2, [place], VD25OH  ? ?Review of Systems  ?Constitutional:  Negative for chills and fever.  ?HENT:  Negative for congestion, drooling, ear discharge, ear pain, hoarse voice, sinus pressure, sinus pain, sore throat and trouble swallowing.   ?Respiratory:  Negative for cough, shortness of breath, wheezing and stridor.    ?Cardiovascular:  Negative for chest pain, palpitations and leg swelling.  ?Gastrointestinal:  Negative for abdominal pain, blood in stool, constipation, diarrhea, nausea and vomiting.  ?Endocrine: Negative for polydipsia.  ?Genitourinary:  Negative for dysuria, frequency, hematuria and urgency.  ?Musculoskeletal:  Positive for myalgias. Negative for back pain and neck pain.  ?Skin:  Negative for rash.  ?Allergic/Immunologic: Negative for environmental allergies.  ?Neurological:  Positive for headaches. Negative for dizziness.  ?Hematological:  Does not bruise/bleed easily.  ?Psychiatric/Behavioral:  Negative for suicidal ideas. The patient is not nervous/anxious.   ? ?Patient Active Problem List  ? Diagnosis Date Noted  ? Hyperlipidemia 09/11/2016  ? 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 Alcohol intoxication ([place]) 04/22/2016  ? Pneumonia 04/22/2016  ? Hypertension 12/02/2015  ? GERD (gastroesophageal reflux disease) 12/02/2015  ? Viral syndrome 12/02/2015  ? ? ?No Known Allergies ? ?No past surgical history on file. ? ?Social History  ? ?Tobacco Use  ? Smoking status: Never  ? Smokeless tobacco: Current  ?  Types: Snuff  ?Substance Use Topics  ? Alcohol use: Yes  ?  Alcohol/week: 6.0 standard drinks  ?  Types: 6 Cans of beer per week  ?  Comment: daily  ? Drug use: Never  ? ? ? ?Medication list has been reviewed and updated. ? ?Current Meds  ?Medication Sig  ? acyclovir (ZOVIRAX) 200 MG capsule Take 1 capsule (200 mg total)  by mouth daily.  ? COLLAGEN PO Take 4,000 mg by mouth daily.  ? esomeprazole (NEXIUM) 40 MG capsule Take 1 capsule (40 mg total) by mouth daily at 12 noon. otc  ? losartan (COZAAR) 100 MG tablet Take 1 tablet (100 mg total) by mouth daily.  ? metoprolol succinate (TOPROL-XL) 25 MG 24 hr tablet Take 1 tablet (25 mg total) by mouth daily.  ? milk thistle 175 MG tablet Take 175 mg by mouth daily.  ? montelukast (SINGULAIR) 10 MG tablet Take 1 tablet (10 mg total) by mouth at bedtime.  ? Multiple Vitamin  (MULTIVITAMIN WITH MINERALS) TABS tablet Take 1 tablet by mouth daily.  ? Omega-3 1000 MG CAPS Take 1 capsule (1,000 mg total) by mouth daily.  ? ? ?PHQ 2/9 Scores 07/17/2021 09/13/2020 09/06/2020 09/03/2019  ?PHQ - 2 Score 0 0 0 0  ?PHQ- 9 Score 0 0 0 0  ? ? ?GAD 7 : Generalized Anxiety Score 07/17/2021 09/13/2020 09/06/2020 09/03/2019  ?Nervous, Anxious, on Edge 0 0 0 0  ?Control/stop worrying 0 0 0 0  ?Worry too much - different things 0 0 0 0  ?Trouble relaxing 0 0 0 0  ?Restless 0 0 0 0  ?Easily annoyed or irritable 0 0 0 0  ?Afraid - awful might happen 0 0 0 0  ?Total GAD 7 Score 0 0 0 0  ?Anxiety Difficulty Not difficult at all - Not difficult at all -  ? ? ?BP Readings from Last 3 Encounters:  ?07/28/21 120/88  ?07/17/21 120/80  ?01/13/21 120/80  ? ? ?Physical Exam ?Vitals and nursing note reviewed.  ?HENT:  ?   Head: Normocephalic.  ?   Right Ear: Hearing, tympanic membrane, ear canal and external ear normal. No swelling.  ?   Left Ear: Hearing, tympanic membrane, ear canal and external ear normal. No swelling.  ?   Nose: No congestion or rhinorrhea.  ?   Right Turbinates: Swollen. Not enlarged.  ?   Left Turbinates: Swollen. Not enlarged.  ?   Right Sinus: Maxillary sinus tenderness present. No frontal sinus tenderness.  ?   Left Sinus: No maxillary sinus tenderness or frontal sinus tenderness.  ?   Mouth/Throat:  ?   Lips: Pink.  ?   Mouth: Mucous membranes are moist. No oral lesions.  ?   Tongue: No lesions.  ?   Palate: No mass.  ?   Pharynx: Oropharynx is clear. Uvula midline. No pharyngeal swelling, oropharyngeal exudate, posterior oropharyngeal erythema or uvula swelling.  ?   Tonsils: No tonsillar exudate or tonsillar abscesses.  ?Eyes:  ?   General: No scleral icterus.    ?   Right eye: No discharge.     ?   Left eye: No discharge.  ?   Conjunctiva/sclera: Conjunctivae normal.  ?   Pupils: Pupils are equal, round, and reactive to light.  ?Neck:  ?   Thyroid: No thyromegaly.  ?   Vascular: No JVD.  ?    Trachea: No tracheal deviation.  ?Cardiovascular:  ?   Rate and Rhythm: Normal rate and regular rhythm.  ?   Heart sounds: Normal heart sounds. No murmur heard. ?  No friction rub. No gallop.  ?Pulmonary:  ?   Effort: No respiratory distress.  ?   Breath sounds: Normal breath sounds. No wheezing or rales.  ?Abdominal:  ?   General: Bowel sounds are normal.  ?   Palpations: Abdomen is soft. There is no mass.  ?  Tenderness: There is no abdominal tenderness. There is no guarding or rebound.  ?Musculoskeletal:     ?   General: No tenderness. Normal range of motion.  ?   Cervical back: Normal range of motion and neck supple.  ?Lymphadenopathy:  ?   Cervical: No cervical adenopathy.  ?Skin: ?   General: Skin is warm.  ?   Findings: No rash.  ?Neurological:  ?   Mental Status: He is alert and oriented to person, place, and time.  ?   Cranial Nerves: No cranial nerve deficit.  ?   Deep Tendon Reflexes: Reflexes are normal and symmetric.  ? ? ?Wt Readings from Last 3 Encounters:  ?07/28/21 179 lb (81.2 kg)  ?07/17/21 172 lb (78 kg)  ?01/13/21 167 lb (75.8 kg)  ? ? ?BP 120/88   Pulse 100   Temp 98.7 ?F (37.1 ?C) (Oral)   Ht $R'5\' 7"'ED$  (1.702 m)   Wt 179 lb (81.2 kg)   BMI 28.04 kg/m?  ? ?Assessment and Plan: ? ?1. Fever and chills ?New onset fever and chills with myalgias.  First we will check COVID.  COVID is noted to be negative. ?- POC COVID-19 ? ?2. Pharyngitis, unspecified etiology ?Patient's primary symptom is pharyngitis particular on the right side there is mild erythema noted but no exudates.  Rapid strep test is negative ?- POCT rapid strep A ? ?3. Acute recurrent maxillary sinusitis ?New onset.  Persistent.  There is tenderness over the right maxillary sinus.  With some adenopathy submandibular which is noted to be tender.  Likely early maxillary sinusitis we will treat with amoxicillin 500 mg 3 times a day for 10 days. ?- amoxicillin (AMOXIL) 500 MG capsule; Take 1 capsule (500 mg total) by mouth 3 (three) times  daily for 10 days.  Dispense: 30 capsule; Refill: 0  ? ? ?

## 2021-08-07 ENCOUNTER — Encounter: Payer: Self-pay | Admitting: Gastroenterology

## 2021-08-17 ENCOUNTER — Encounter
Admission: RE | Disposition: A | Discharge status: Home / Self Care | Payer: Self-pay | Source: Home / Self Care | Attending: Gastroenterology

## 2021-08-17 ENCOUNTER — Ambulatory Visit
Admission: RE | Admit: 2021-08-17 | Discharge: 2021-08-17 | Disposition: A | Discharge status: Home / Self Care | Payer: No Typology Code available for payment source | Attending: Gastroenterology | Admitting: Gastroenterology

## 2021-08-17 ENCOUNTER — Other Ambulatory Visit: Payer: Self-pay

## 2021-08-17 ENCOUNTER — Ambulatory Visit: Payer: No Typology Code available for payment source | Admitting: Anesthesiology

## 2021-08-17 ENCOUNTER — Encounter: Payer: Self-pay | Admitting: Gastroenterology

## 2021-08-17 DIAGNOSIS — D125 Benign neoplasm of sigmoid colon: Secondary | ICD-10-CM | POA: Insufficient documentation

## 2021-08-17 DIAGNOSIS — K219 Gastro-esophageal reflux disease without esophagitis: Secondary | ICD-10-CM | POA: Diagnosis not present

## 2021-08-17 DIAGNOSIS — K640 First degree hemorrhoids: Secondary | ICD-10-CM | POA: Insufficient documentation

## 2021-08-17 DIAGNOSIS — I10 Essential (primary) hypertension: Secondary | ICD-10-CM | POA: Insufficient documentation

## 2021-08-17 DIAGNOSIS — F17290 Nicotine dependence, other tobacco product, uncomplicated: Secondary | ICD-10-CM | POA: Diagnosis not present

## 2021-08-17 DIAGNOSIS — K635 Polyp of colon: Secondary | ICD-10-CM | POA: Diagnosis not present

## 2021-08-17 DIAGNOSIS — K621 Rectal polyp: Secondary | ICD-10-CM | POA: Insufficient documentation

## 2021-08-17 DIAGNOSIS — Z1211 Encounter for screening for malignant neoplasm of colon: Secondary | ICD-10-CM

## 2021-08-17 HISTORY — PX: POLYPECTOMY: SHX5525

## 2021-08-17 HISTORY — PX: COLONOSCOPY WITH PROPOFOL: SHX5780

## 2021-08-17 SURGERY — COLONOSCOPY WITH PROPOFOL
Anesthesia: General | Site: Rectum

## 2021-08-17 MED ORDER — LACTATED RINGERS IV SOLN: INTRAVENOUS | Status: DC

## 2021-08-17 MED ORDER — SODIUM CHLORIDE 0.9 % IV SOLN: INTRAVENOUS | Status: DC

## 2021-08-17 MED ORDER — PROPOFOL 10 MG/ML IV BOLUS
INTRAVENOUS | Status: DC | PRN
  Administered 2021-08-17: 20 mg via INTRAVENOUS
  Administered 2021-08-17 (×3): 30 mg via INTRAVENOUS
  Administered 2021-08-17: 20 mg via INTRAVENOUS
  Administered 2021-08-17 (×2): 30 mg via INTRAVENOUS
  Administered 2021-08-17: 150 mg via INTRAVENOUS
  Administered 2021-08-17: 30 mg via INTRAVENOUS
  Administered 2021-08-17: 20 mg via INTRAVENOUS

## 2021-08-17 MED ORDER — LIDOCAINE HCL (CARDIAC) PF 100 MG/5ML IV SOSY
PREFILLED_SYRINGE | INTRAVENOUS | Status: DC | PRN
  Administered 2021-08-17: 30 mg via INTRAVENOUS

## 2021-08-17 SURGICAL SUPPLY — 8 items
GOWN CVR UNV OPN BCK APRN NK (MISCELLANEOUS) ×4 IMPLANT
GOWN ISOL THUMB LOOP REG UNIV (MISCELLANEOUS) ×6
KIT PRC NS LF DISP ENDO (KITS) ×2 IMPLANT
KIT PROCEDURE OLYMPUS (KITS) ×3
MANIFOLD NEPTUNE II (INSTRUMENTS) ×3 IMPLANT
SNARE COLD EXACTO (MISCELLANEOUS) ×1 IMPLANT
TRAP ETRAP POLY (MISCELLANEOUS) ×1 IMPLANT
WATER STERILE IRR 250ML POUR (IV SOLUTION) ×3 IMPLANT

## 2021-08-17 NOTE — H&P (Signed)
? ?Lucilla Lame, MD [facility] ?[address] ?[place], [place] [address] ?Phone: [phone] ?Fax : [phone] ? ?Primary Care Physician:  Juline Patch, MD ?Primary Gastroenterologist:  Dr. Allen Norris ? ?Pre-Procedure History & Physical: ?HPI:  Matthew Zuniga. is a 51 y.o. male is here for a screening colonoscopy.  ? ?Past Medical History:  ?Diagnosis Date  ? GERD (gastroesophageal reflux disease)   ? Hyperlipidemia   ? Hypertension   ? Viral syndrome   ? ? ?History reviewed. No pertinent surgical history. ? ?Prior to Admission medications   ?Medication Sig Start Date End Date Taking? Authorizing Provider  ?acyclovir (ZOVIRAX) 200 MG capsule Take 1 capsule (200 mg total) by mouth daily. 07/17/21  Yes Juline Patch, MD  ?esomeprazole (NEXIUM) 40 MG capsule Take 1 capsule (40 mg total) by mouth daily at 12 noon. otc 06/27/20  Yes Juline Patch, MD  ?losartan (COZAAR) 100 MG tablet Take 1 tablet (100 mg total) by mouth daily. 07/17/21  Yes Juline Patch, MD  ?metoprolol succinate (TOPROL-XL) 25 MG 24 hr tablet Take 1 tablet (25 mg total) by mouth daily. 07/17/21  Yes Juline Patch, MD  ?milk thistle 175 MG tablet Take 175 mg by mouth daily.   Yes [provider]  ?montelukast (SINGULAIR) 10 MG tablet Take 1 tablet (10 mg total) by mouth at bedtime. 07/17/21  Yes Juline Patch, MD  ?Multiple Vitamin (MULTIVITAMIN WITH MINERALS) TABS tablet Take 1 tablet by mouth daily.   Yes [provider]  ?Omega-3 1000 MG CAPS Take 1 capsule (1,000 mg total) by mouth daily. 09/03/19  Yes Juline Patch, MD  ? ? ?Allergies as of 07/17/2021  ? (No Known Allergies)  ? ? ?History reviewed. No pertinent family history. ? ?Social History  ? ?Socioeconomic History  ? Marital status: Married  ?  Spouse name: Not on file  ? Number of children: Not on file  ? Years of education: Not on file  ? Highest education level: Not on file  ?Occupational History  ? Not on file  ?Tobacco Use  ? Smoking status: Never  ?  Smokeless tobacco: Current  ?  Types: Snuff  ?Substance and Sexual Activity  ? Alcohol use: Yes  ?  Alcohol/week: 6.0 standard drinks  ?  Types: 6 Cans of beer per week  ?  Comment: daily  ? Drug use: Never  ? Sexual activity: Yes  ?Other Topics Concern  ? Not on file  ?Social History Narrative  ? Not on file  ? ?Social Determinants of Health  ? ?Financial Resource Strain: Not on file  ?Food Insecurity: Not on file  ?Transportation Needs: Not on file  ?Physical Activity: Not on file  ?Stress: Not on file  ?Social Connections: Not on file  ?Intimate Partner Violence: Not on file  ? ? ?Review of Systems: ?See HPI, otherwise negative ROS ? ?Physical Exam: ?Temp 98 ?F (36.7 ?C) (Temporal)   Resp 18   Ht '5\' 7"'$  (1.702 m)   Wt 74.8 kg   SpO2 95%   BMI 25.84 kg/m?  ?General:   Alert,  pleasant and cooperative in NAD ?Head:  Normocephalic and atraumatic. ?Neck:  Supple; no masses or thyromegaly. ?Lungs:  Clear throughout to auscultation.    ?Heart:  Regular rate and rhythm. ?Abdomen:  Soft, nontender and nondistended. Normal bowel sounds, without guarding, and without rebound.   ?Neurologic:  Alert and  oriented x4;  grossly normal neurologically. ? ?Impression/Plan: ?Matthew Zuniga  Simonne Martinet. is now here to undergo a screening colonoscopy. ? ?Risks, benefits, and alternatives regarding colonoscopy have been reviewed with the patient.  Questions have been answered.  All parties agreeable. ?

## 2021-08-17 NOTE — Transfer of Care (Signed)
Immediate Anesthesia Transfer of Care Note ? ?Patient: Matthew Zuniga. ? ?Procedure(s) Performed: COLONOSCOPY WITH PROPOFOL (Rectum) ?POLYPECTOMY (Rectum) ? ?Patient Location: PACU ? ?Anesthesia Type: General ? ?Level of Consciousness: awake, alert  and patient cooperative ? ?Airway and Oxygen Therapy: Patient Spontanous Breathing and Patient connected to supplemental oxygen ? ?Post-op Assessment: Post-op Vital signs reviewed, Patient's Cardiovascular Status Stable, Respiratory Function Stable, Patent Airway and No signs of Nausea or vomiting ? ?Post-op Vital Signs: Reviewed and stable ? ?Complications: No notable events documented. ? ?

## 2021-08-17 NOTE — Anesthesia Procedure Notes (Signed)
Date/Time: 08/17/2021 9:51 AM ?Performed by: Cameron Ali, CRNA ?Pre-anesthesia Checklist: Patient identified, Emergency Drugs available, Suction available, Timeout performed and Patient being monitored ?Patient Re-evaluated:Patient Re-evaluated prior to induction ?Oxygen Delivery Method: Nasal cannula ?Placement Confirmation: positive ETCO2 ? ? ? ? ?

## 2021-08-17 NOTE — Anesthesia Preprocedure Evaluation (Signed)
Anesthesia Evaluation  ?Patient identified by MRN, date of birth, ID band ? ?History of Anesthesia Complications ?Negative for: history of anesthetic complications ? ?Airway ?Mallampati: II ? ?TM Distance: >3 FB ?Neck ROM: Full ? ? ? Dental ?no notable dental hx. ? ?  ?Pulmonary ?neg pulmonary ROS,  ?  ?Pulmonary exam normal ? ? ? ? ? ? ? Cardiovascular ?Exercise Tolerance: Good ?hypertension, Pt. on medications ?Normal cardiovascular exam ? ? ?  ?Neuro/Psych ?  ? GI/Hepatic ?Neg liver ROS, GERD  ,  ?Endo/Other  ?negative endocrine ROS ? Renal/GU ?negative Renal ROS  ? ?  ?Musculoskeletal ? ? Abdominal ?  ?Peds ? Hematology ?negative hematology ROS ?(+)   ?Anesthesia Other Findings ? ? Reproductive/Obstetrics ? ?  ? ? ? ? ? ? ? ? ? ? ? ? ? ?  ?  ? ? ? ? ? ? ? ?Anesthesia Physical ?Anesthesia Plan ? ?ASA: 2 ? ?Anesthesia Plan: General  ? ?Post-op Pain Management: Minimal or no pain anticipated  ? ?Induction: Intravenous ? ?PONV Risk Score and Plan: 2 and Propofol infusion, TIVA and Treatment may vary due to age or medical condition ? ?Airway Management Planned: Nasal Cannula and Natural Airway ? ?Additional Equipment: None ? ?Intra-op Plan:  ? ?Post-operative Plan:  ? ?Informed Consent: I have reviewed the patients History and Physical, chart, labs and discussed the procedure including the risks, benefits and alternatives for the proposed anesthesia with the patient or authorized representative who has indicated his/her understanding and acceptance.  ? ? ? ? ? ?Plan Discussed with: CRNA ? ?Anesthesia Plan Comments:   ? ? ? ? ? ? ?Anesthesia Quick Evaluation ? ?

## 2021-08-17 NOTE — Anesthesia Postprocedure Evaluation (Signed)
Anesthesia Post Note ? ?Patient: Matthew Zuniga. ? ?Procedure(s) Performed: COLONOSCOPY WITH PROPOFOL (Rectum) ?POLYPECTOMY (Rectum) ? ? ?  ?Patient location during evaluation: PACU ?Anesthesia Type: General ?Level of consciousness: awake and alert ?Pain management: pain level controlled ?Vital Signs Assessment: post-procedure vital signs reviewed and stable ?Respiratory status: spontaneous breathing, nonlabored ventilation, respiratory function stable and patient connected to nasal cannula oxygen ?Cardiovascular status: blood pressure returned to baseline and stable ?Postop Assessment: no apparent nausea or vomiting ?Anesthetic complications: no ? ? ?No notable events documented. ? ?Adele Barthel Tobias Avitabile ? ? ? ? ? ?

## 2021-08-17 NOTE — Op Note (Signed)
[facility] ?Gastroenterology ?Patient Name: Matthew Zuniga ?Procedure Date: 08/17/2021 9:52 AM ?MRN: [id number] ?Account #: [id number] ?Date of Birth: Aug 25, 1970 ?Admit Type: Outpatient ?Age: 51 ?Room: [facility] OR ROOM 01 ?Gender: Male ?Note Status: Finalized ?Instrument Name: [id number] ?Procedure:             Colonoscopy ?Indications:           Screening for colorectal malignant neoplasm ?Providers:             Lucilla Lame MD, MD ?Referring MD:          Juline Patch, MD (Referring MD) ?Medicines:             Propofol per Anesthesia ?Complications:         No immediate complications. ?Procedure:             Pre-Anesthesia Assessment: ?                       - Prior to the procedure, a History and Physical was  ?                       performed, and patient medications and allergies were  ?                       reviewed. The patient's tolerance of previous  ?                       anesthesia was also reviewed. The risks and benefits  ?                       of the procedure and the sedation options and risks  ?                       were discussed with the patient. All questions were  ?                       answered, and informed consent was obtained. Prior  ?                       Anticoagulants: The patient has taken no previous  ?                       anticoagulant or antiplatelet agents. ASA Grade  ?                       Assessment: II - A patient with mild systemic disease.  ?                       After reviewing the risks and benefits, the patient  ?                       was deemed in satisfactory condition to undergo the  ?                       procedure. ?                       After obtaining informed consent, the colonoscope was  ?  passed under direct vision. Throughout the procedure,  ?                       the patient's blood pressure, pulse, and oxygen  ?                       saturations were monitored continuously. The  ?                       Colonoscope  was introduced through the anus and  ?                       advanced to the the cecum, identified by appendiceal  ?                       orifice and ileocecal valve. The colonoscopy was  ?                       performed without difficulty. The patient tolerated  ?                       the procedure well. The quality of the bowel  ?                       preparation was excellent. ?Findings: ?     The perianal and digital rectal examinations were normal. ?     A 4 mm polyp was found in the sigmoid colon. The polyp was sessile. The  ?     polyp was removed with a cold snare. Resection and retrieval were  ?     complete. ?     Two sessile polyps were found in the rectum. The polyps were 3 to 7 mm  ?     in size. These polyps were removed with a cold snare. Resection and  ?     retrieval were complete. ?     Non-bleeding internal hemorrhoids were found during retroflexion. The  ?     hemorrhoids were Grade I (internal hemorrhoids that do not prolapse). ?Impression:            - One 4 mm polyp in the sigmoid colon, removed with a  ?                       cold snare. Resected and retrieved. ?                       - Two 3 to 7 mm polyps in the rectum, removed with a  ?                       cold snare. Resected and retrieved. ?                       - Non-bleeding internal hemorrhoids. ?Recommendation:        - Discharge patient to home. ?                       - Resume previous diet. ?                       - Continue present medications. ?                       -  Await pathology results. ?                       - If the pathology report reveals adenomatous tissue,  ?                       then repeat the colonoscopy for surveillance in 7  ?                       years. ?Procedure Code(s):     --- Professional --- ?                       [phone], Colonoscopy, flexible; with removal of  ?                       tumor(s), polyp(s), or other lesion(s) by snare  ?                       technique ?Diagnosis Code(s):     ---  Professional --- ?                       Z12.11, Encounter for screening for malignant neoplasm  ?                       of colon ?                       K63.5, Polyp of colon ?CPT copyright 2019 [employer]. All rights reserved. ?The codes documented in this report are preliminary and upon coder review may  ?be revised to meet current compliance requirements. ?Lucilla Lame MD, MD ?08/17/2021 10:10:57 AM ?This report has been signed electronically. ?Number of Addenda: 0 ?Note Initiated On: 08/17/2021 9:52 AM ?Scope Withdrawal Time: 0 hours 7 minutes 21 seconds  ?Total Procedure Duration: 0 hours 11 minutes 24 seconds  ?Estimated Blood Loss:  Estimated blood loss: none. ?     [facility] ?

## 2021-08-18 ENCOUNTER — Encounter: Payer: Self-pay | Admitting: Gastroenterology

## 2021-08-18 LAB — SURGICAL PATHOLOGY

## 2021-08-21 ENCOUNTER — Encounter: Payer: Self-pay | Admitting: Gastroenterology

## 2022-01-31 ENCOUNTER — Other Ambulatory Visit
Admission: RE | Admit: 2022-01-31 | Discharge: 2022-01-31 | Disposition: A | Discharge status: Home / Self Care | Payer: No Typology Code available for payment source | Attending: Family Medicine | Admitting: Family Medicine

## 2022-01-31 ENCOUNTER — Ambulatory Visit: Payer: No Typology Code available for payment source | Admitting: Family Medicine

## 2022-01-31 ENCOUNTER — Encounter: Payer: Self-pay | Admitting: Family Medicine

## 2022-01-31 ENCOUNTER — Ambulatory Visit: Payer: Self-pay | Admitting: *Deleted

## 2022-01-31 VITALS — BP 120/80 | HR 80 | Ht 67.0 in | Wt 181.0 lb

## 2022-01-31 DIAGNOSIS — K625 Hemorrhage of anus and rectum: Secondary | ICD-10-CM | POA: Insufficient documentation

## 2022-01-31 DIAGNOSIS — K921 Melena: Secondary | ICD-10-CM | POA: Diagnosis not present

## 2022-01-31 DIAGNOSIS — R1030 Lower abdominal pain, unspecified: Secondary | ICD-10-CM

## 2022-01-31 DIAGNOSIS — R197 Diarrhea, unspecified: Secondary | ICD-10-CM

## 2022-01-31 LAB — CBC WITH DIFFERENTIAL/PLATELET
Abs Immature Granulocytes: 0.02 10*3/uL (ref 0.00–0.07)
Basophils Absolute: 0.1 10*3/uL (ref 0.0–0.1)
Basophils Relative: 1 %
Eosinophils Absolute: 0.2 10*3/uL (ref 0.0–0.5)
Eosinophils Relative: 2 %
HCT: 37 % — ABNORMAL LOW (ref 39.0–52.0)
Hemoglobin: 13.4 g/dL (ref 13.0–17.0)
Immature Granulocytes: 0 %
Lymphocytes Relative: 37 %
Lymphs Abs: 2.8 10*3/uL (ref 0.7–4.0)
MCH: 34.1 pg — ABNORMAL HIGH (ref 26.0–34.0)
MCHC: 36.2 g/dL — ABNORMAL HIGH (ref 30.0–36.0)
MCV: 94.1 fL (ref 80.0–100.0)
Monocytes Absolute: 0.7 10*3/uL (ref 0.1–1.0)
Monocytes Relative: 9 %
Neutro Abs: 4 10*3/uL (ref 1.7–7.7)
Neutrophils Relative %: 51 %
Platelets: 225 10*3/uL (ref 150–400)
RBC: 3.93 MIL/uL — ABNORMAL LOW (ref 4.22–5.81)
RDW: 11.2 % — ABNORMAL LOW (ref 11.5–15.5)
WBC: 7.8 10*3/uL (ref 4.0–10.5)
nRBC: 0 % (ref 0.0–0.2)

## 2022-01-31 NOTE — Patient Instructions (Signed)

## 2022-01-31 NOTE — Progress Notes (Signed)
Date:  01/31/2022   Name:  Matthew Zuniga.   DOB:  December 31, 1970   MRN:  [id number]   Chief Complaint: Abdominal Pain (Mid front abd pain- started 2 months ago after eating. Went to [facility] thinking he had to have a BM and blood came out)  Abdominal Pain This is a new problem. The current episode started 1 to 4 weeks ago (4 weeks). The onset quality is sudden. The problem occurs daily (diarrhea). The pain is located in the LLQ, RLQ and suprapubic region. The pain is moderate. The quality of the pain is cramping. Associated symptoms include diarrhea and hematochezia. Pertinent negatives include no fever, flatus or nausea. The pain is aggravated by bowel movement. The pain is relieved by Bowel movements. Treatments tried: imodium/pepto. The treatment provided no relief.  Diarrhea  This is a new problem. The current episode started 1 to 4 weeks ago (1 week). The problem occurs 2 to 4 times per day. The problem has been unchanged. The stool consistency is described as Bloody and watery (blood today). The patient states that diarrhea awakens him from sleep. Associated symptoms include abdominal pain. Pertinent negatives include no fever or increased  flatus.    Lab Results  Component Value Date   NA 134 01/13/2021   K 4.6 01/13/2021   CO2 24 01/13/2021   GLUCOSE 99 01/13/2021   BUN 13 01/13/2021   CREATININE 0.75 (L) 01/13/2021   CALCIUM 9.6 01/13/2021   EGFR 112 01/13/2021   GFRNONAA 103 02/06/2019   Lab Results  Component Value Date   CHOL 246 (H) 01/13/2021   HDL 118 01/13/2021   LDLCALC 119 (H) 01/13/2021   TRIG 54 01/13/2021   CHOLHDL 2.4 02/06/2019   No results found for: "TSH" No results found for: "HGBA1C" Lab Results  Component Value Date   WBC 5.9 11/01/2014   HGB 14.3 11/01/2014   HCT 41.1 11/01/2014   MCV 98 (H) 11/01/2014   PLT 242 11/01/2014   Lab Results  Component Value Date   ALT 33 11/01/2014   AST 30 11/01/2014   ALKPHOS 67 11/01/2014   BILITOT 0.4  11/01/2014   No results found for: "25OHVITD2", "25OHVITD3", "VD25OH"   Review of Systems  Constitutional:  Negative for fever.  Respiratory:  Negative for shortness of breath.   Cardiovascular:  Negative for chest pain.  Gastrointestinal:  Positive for abdominal pain, blood in stool, diarrhea and hematochezia. Negative for abdominal distention, flatus and nausea.  Neurological:  Negative for light-headedness.    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   Alcohol intoxication (HCC) 04/22/2016   Pneumonia 04/22/2016   Hypertension 12/02/2015   GERD (gastroesophageal reflux disease) 12/02/2015   Viral syndrome 12/02/2015    No Known Allergies  Past Surgical History:  Procedure Laterality Date   COLONOSCOPY WITH PROPOFOL N/A 08/17/2021   Procedure: COLONOSCOPY WITH PROPOFOL;  Surgeon: Midge Minium, MD;  Location: [facility];  Service: Endoscopy;  Laterality: N/A;   POLYPECTOMY  08/17/2021   Procedure: POLYPECTOMY;  Surgeon: Midge Minium, MD;  Location: [facility];  Service: Endoscopy;;    Social History   Tobacco Use   Smoking status: Never   Smokeless tobacco: Current    Types: Snuff  Substance Use Topics   Alcohol use: Yes    Alcohol/week: 6.0 standard drinks of alcohol  Types: 6 Cans of beer per week    Comment: daily   Drug use: Never     Medication list has been reviewed and updated.  Current Meds  Medication Sig   acyclovir (ZOVIRAX) 200 MG capsule Take 1 capsule (200 mg total) by mouth daily.   esomeprazole (NEXIUM) 40 MG capsule Take 1 capsule (40 mg total) by mouth daily at 12 noon. otc   losartan (COZAAR) 100 MG tablet Take 1 tablet (100 mg total) by mouth daily.   metoprolol succinate (TOPROL-XL) 25 MG 24 hr tablet Take 1 tablet (25 mg total) by mouth daily.   milk thistle 175 MG tablet Take 175 mg by mouth daily.    montelukast (SINGULAIR) 10 MG tablet Take 1 tablet (10 mg total) by mouth at bedtime.   Multiple Vitamin (MULTIVITAMIN WITH MINERALS) TABS tablet Take 1 tablet by mouth daily.   Omega-3 1000 MG CAPS Take 1 capsule (1,000 mg total) by mouth daily.       01/31/2022    3:45 PM 07/17/2021    9:52 AM 09/13/2020    7:57 AM 09/06/2020    1:41 PM  GAD 7 : Generalized Anxiety Score  Nervous, Anxious, on Edge 0 0 0 0  Control/stop worrying 0 0 0 0  Worry too much - different things 0 0 0 0  Trouble relaxing 0 0 0 0  Restless 0 0 0 0  Easily annoyed or irritable 0 0 0 0  Afraid - awful might happen 0 0 0 0  Total GAD 7 Score 0 0 0 0  Anxiety Difficulty Not difficult at all Not difficult at all  Not difficult at all       01/31/2022    3:45 PM 07/17/2021    9:51 AM 09/13/2020    7:57 AM  Depression screen PHQ 2/9  Decreased Interest 0 0 0  Down, Depressed, Hopeless 0 0 0  PHQ - 2 Score 0 0 0  Altered sleeping 0 0 0  Tired, decreased energy 0 0 0  Change in appetite 0 0 0  Feeling bad or failure about yourself  0 0 0  Trouble concentrating 0 0 0  Moving slowly or fidgety/restless 0 0 0  Suicidal thoughts 0 0 0  PHQ-9 Score 0 0 0  Difficult doing work/chores Not difficult at all Not difficult at all     BP Readings from Last 3 Encounters:  01/31/22 120/80  08/17/21 115/85  07/28/21 120/88    Physical Exam Vitals and nursing note reviewed.  HENT:     Head: Normocephalic.     Right Ear: External ear normal.     Left Ear: External ear normal.     Nose: Nose normal.  Eyes:     General: No scleral icterus.       Right eye: No discharge.        Left eye: No discharge.     Conjunctiva/sclera: Conjunctivae normal.     Pupils: Pupils are equal, round, and reactive to light.  Neck:     Thyroid: No thyromegaly.     Vascular: No JVD.     Trachea: No tracheal deviation.  Cardiovascular:     Rate and Rhythm: Normal rate and regular rhythm.     Heart sounds: Normal heart sounds. No  murmur heard.    No friction rub. No gallop.  Pulmonary:     Effort: No respiratory distress.     Breath sounds: Normal breath sounds. No wheezing or  rales.  Abdominal:     General: Bowel sounds are normal.     Palpations: Abdomen is soft. There is no mass.     Tenderness: There is abdominal tenderness in the right lower quadrant, suprapubic area and left lower quadrant. There is no guarding or rebound.  Genitourinary:    Rectum: Guaiac result negative. Tenderness present. No mass.       Comments: Perianal tender 12 o 'clock/standing perspective Musculoskeletal:        General: No tenderness. Normal range of motion.     Cervical back: Normal range of motion and neck supple.  Lymphadenopathy:     Cervical: No cervical adenopathy.  Skin:    General: Skin is warm.     Findings: No rash.  Neurological:     Mental Status: He is alert and oriented to person, place, and time.     Cranial Nerves: No cranial nerve deficit.     Deep Tendon Reflexes: Reflexes are normal and symmetric.     Wt Readings from Last 3 Encounters:  01/31/22 181 lb (82.1 kg)  08/17/21 165 lb (74.8 kg)  07/28/21 179 lb (81.2 kg)    BP 120/80   Pulse 80   Ht $R'5\' 7"'qg$  (1.702 m)   Wt 181 lb (82.1 kg)   BMI 28.35 kg/m   Assessment and Plan: 1. Diarrhea, unspecified type 4-week duration of watery diarrhea usually occurring after eating.  We will obtain O&P and stool culture and we will contact GI and that he is recently had a colonoscopy this year in March but there may be is some other issues going on particularly with the diarrhea of a colitis nature.  2. Hematochezia Persistent during the day and associated with lower abdominal pain morning patient noticed that there was a clot of blood in the toilet with some spreading of blood to suggest that there was bleeding in the lower GI tract.  Including in the differential the patient does have internal hemorrhoids and perhaps these may have been irritated.  We will  also look for an infectious nature with O&P and stool culture.  No diverticular calculi were noted on colonoscopy.  We will obtain a CBC just to know if there is any sudden drop in hematocrit or hemoglobin.  3. Lower abdominal pain Lower abdominal pain associated with blood just today which is watery in nature and has persisted for 4 weeks.  There is no fever or chills.  Today was the first day that he looked in the toilet and saw what looks to be a clot and blood escaping into the water.  No rebound or guarding is noted.    Otilio Miu, MD

## 2022-01-31 NOTE — Telephone Encounter (Signed)
  Chief Complaint: blood in stool Symptoms: had diarrheal stool 30 minutes ago and noted red blood in stool. "Looked like tampon". Low abdominal pain  Frequency: today  Pertinent Negatives: Patient denies fever, no feeling of passing out no clots noted no N/V. No dizziness Disposition: '[]'$ ED /'[]'$ Urgent Care (no appt availability in office) / '[x]'$ Appointment(In office/virtual)/ '[]'$  [place] [facility]/ '[]'$ Home Care/ '[]'$ Refused Recommended Disposition /'[]'$ [place] [facility]/ '[]'$  Follow-up with PCP Additional Notes:   Appt scheduled for today .    Reason for Disposition  MILD rectal bleeding (more than just a few drops or streaks)  Answer Assessment - Initial Assessment Questions 1. APPEARANCE of BLOOD: "What color is it?" "Is it passed separately, on the surface of the stool, or mixed in with the stool?"      Red blood in stool. "Looks like a tampon" 2. AMOUNT: "How much blood was passed?"      Na  3. FREQUENCY: "How many times has blood been passed with the stools?"      1 time 4. ONSET: "When was the blood first seen in the stools?" (Days or weeks)      30 minutes ago  5. DIARRHEA: "Is there also some diarrhea?" If Yes, ask: "How many diarrhea stools in the past 24 hours?"      Diarrhea  6. CONSTIPATION: "Do you have constipation?" If Yes, ask: "How bad is it?"     no 7. RECURRENT SYMPTOMS: "Have you had blood in your stools before?" If Yes, ask: "When was the last time?" and "What happened that time?"      No  8. BLOOD THINNERS: "Do you take any blood thinners?" (e.g., Coumadin/warfarin, Pradaxa/dabigatran, aspirin)     na 9. OTHER SYMPTOMS: "Do you have any other symptoms?"  (e.g., abdomen pain, vomiting, dizziness, fever)     Abdominal pain  10. PREGNANCY: "Is there any chance you are pregnant?" "When was your last menstrual period?"       na  Protocols used: Rectal Bleeding-A-AH

## 2022-02-01 ENCOUNTER — Other Ambulatory Visit: Payer: Self-pay

## 2022-02-01 DIAGNOSIS — R197 Diarrhea, unspecified: Secondary | ICD-10-CM

## 2022-02-01 NOTE — Progress Notes (Signed)
Printed stool culture

## 2022-02-02 ENCOUNTER — Encounter: Payer: Self-pay | Admitting: Family Medicine

## 2022-02-05 LAB — STOOL CULTURE: E coli, Shiga toxin Assay: NEGATIVE

## 2022-02-05 LAB — OVA AND PARASITE EXAMINATION

## 2022-03-13 ENCOUNTER — Other Ambulatory Visit: Payer: Self-pay | Admitting: Family Medicine

## 2022-03-13 DIAGNOSIS — I10 Essential (primary) hypertension: Secondary | ICD-10-CM

## 2022-03-27 ENCOUNTER — Ambulatory Visit: Payer: No Typology Code available for payment source | Admitting: Family Medicine

## 2022-03-27 ENCOUNTER — Telehealth: Payer: Self-pay

## 2022-03-27 ENCOUNTER — Encounter: Payer: Self-pay | Admitting: Family Medicine

## 2022-03-27 VITALS — BP 128/76 | HR 80 | Ht 67.0 in | Wt 178.0 lb

## 2022-03-27 DIAGNOSIS — I10 Essential (primary) hypertension: Secondary | ICD-10-CM | POA: Diagnosis not present

## 2022-03-27 DIAGNOSIS — D229 Melanocytic nevi, unspecified: Secondary | ICD-10-CM

## 2022-03-27 DIAGNOSIS — B349 Viral infection, unspecified: Secondary | ICD-10-CM | POA: Diagnosis not present

## 2022-03-27 DIAGNOSIS — E785 Hyperlipidemia, unspecified: Secondary | ICD-10-CM | POA: Diagnosis not present

## 2022-03-27 MED ORDER — ACYCLOVIR 200 MG PO CAPS: 200.0000 mg | ORAL_CAPSULE | Freq: Every day | ORAL | 5 refills | Status: DC

## 2022-03-27 MED ORDER — LOSARTAN POTASSIUM 100 MG PO TABS: 100.0000 mg | ORAL_TABLET | Freq: Every day | ORAL | 5 refills | Status: DC

## 2022-03-27 MED ORDER — METOPROLOL SUCCINATE ER 25 MG PO TB24: ORAL_TABLET | ORAL | 5 refills | Status: DC

## 2022-03-27 MED ORDER — MONTELUKAST SODIUM 10 MG PO TABS: 10.0000 mg | ORAL_TABLET | Freq: Every day | ORAL | 5 refills | Status: DC

## 2022-03-27 NOTE — Telephone Encounter (Signed)
I spoke with Ginger concerning pt's question about getting an endoscopy. Ginger does not see any note of pt needing to proceed with endoscopy. I asked pt if he wants to be referred back to [facility] for val of GERD and possible endoscopy. He stated it is okay for now. I explained to him GI can be 2 months out, not to wait until he cannot tolerate it. Pt voiced understanding.

## 2022-03-27 NOTE — Progress Notes (Signed)
Date:  03/27/2022   Name:  Matthew Zuniga.   DOB:  03-05-1971   MRN:  [id number]   Chief Complaint: Hypertension, Allergic Rhinitis , and viral syndrome  Hypertension This is a chronic problem. The current episode started more than 1 year ago. The problem has been gradually improving since onset. The problem is controlled. Pertinent negatives include no chest pain, headaches, neck pain, orthopnea, palpitations, peripheral edema or shortness of breath. There are no associated agents to hypertension. Risk factors for coronary artery disease include dyslipidemia. Past treatments include beta blockers and angiotensin blockers. The current treatment provides moderate improvement. There are no compliance problems.  There is no history of angina, kidney disease, CAD/MI, CVA, heart failure, left ventricular hypertrophy, PVD or retinopathy. There is no history of chronic renal disease, a hypertension causing med or renovascular disease.    Lab Results  Component Value Date   NA 134 01/13/2021   K 4.6 01/13/2021   CO2 24 01/13/2021   GLUCOSE 99 01/13/2021   BUN 13 01/13/2021   CREATININE 0.75 (L) 01/13/2021   CALCIUM 9.6 01/13/2021   EGFR 112 01/13/2021   GFRNONAA 103 02/06/2019   Lab Results  Component Value Date   CHOL 246 (H) 01/13/2021   HDL 118 01/13/2021   LDLCALC 119 (H) 01/13/2021   TRIG 54 01/13/2021   CHOLHDL 2.4 02/06/2019   No results found for: "TSH" No results found for: "HGBA1C" Lab Results  Component Value Date   WBC 7.8 01/31/2022   HGB 13.4 01/31/2022   HCT 37.0 (L) 01/31/2022   MCV 94.1 01/31/2022   PLT 225 01/31/2022   Lab Results  Component Value Date   ALT 33 11/01/2014   AST 30 11/01/2014   ALKPHOS 67 11/01/2014   BILITOT 0.4 11/01/2014   No results found for: "25OHVITD2", "25OHVITD3", "VD25OH"   Review of Systems  Constitutional:  Negative for chills and fever.  HENT:  Negative for drooling, ear discharge, ear pain and sore throat.    Respiratory:  Negative for cough, shortness of breath and wheezing.   Cardiovascular:  Negative for chest pain, palpitations, orthopnea and leg swelling.  Gastrointestinal:  Negative for abdominal pain, blood in stool, constipation, diarrhea and nausea.  Endocrine: Negative for polydipsia.  Genitourinary:  Negative for dysuria, frequency, hematuria and urgency.  Musculoskeletal:  Negative for back pain, myalgias and neck pain.  Skin:  Negative for rash.  Allergic/Immunologic: Negative for environmental allergies.  Neurological:  Negative for dizziness and headaches.  Hematological:  Does not bruise/bleed easily.  Psychiatric/Behavioral:  Negative for suicidal ideas. The patient is not nervous/anxious.     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   Alcohol intoxication ([place]) 04/22/2016   Pneumonia 04/22/2016   Hypertension 12/02/2015   GERD (gastroesophageal reflux disease) 12/02/2015   Viral syndrome 12/02/2015    No Known Allergies  Past Surgical History:  Procedure Laterality Date   COLONOSCOPY WITH PROPOFOL N/A 08/17/2021   Procedure: COLONOSCOPY WITH PROPOFOL;  Surgeon: Lucilla Lame, MD;  Location: [place];  Service: Endoscopy;  Laterality: N/A;   POLYPECTOMY  08/17/2021   Procedure: POLYPECTOMY;  Surgeon: Lucilla Lame, MD;  Location: [facility];  Service: Endoscopy;;    Social History   Tobacco Use   Smoking status: Never   Smokeless tobacco: Current    Types: Snuff  Substance Use Topics   Alcohol use: Yes    Alcohol/week: 6.0 standard drinks of alcohol    Types: 6 Cans of beer per week    Comment: daily   Drug use: Never     Medication list has been reviewed and updated.  Current Meds  Medication Sig   acyclovir (ZOVIRAX) 200 MG capsule Take 1 capsule (200 mg total) by mouth daily.   esomeprazole (NEXIUM) 40 MG capsule  Take 1 capsule (40 mg total) by mouth daily at 12 noon. otc   losartan (COZAAR) 100 MG tablet Take 1 tablet (100 mg total) by mouth daily.   metoprolol succinate (TOPROL-XL) 25 MG 24 hr tablet TAKE 1 TABLET BY MOUTH ONCE DAILY DISCONTINUE  HCTZ  AND  ADD  METOPROLOL  TO  LOSARTAN   milk thistle 175 MG tablet Take 175 mg by mouth daily.   montelukast (SINGULAIR) 10 MG tablet Take 1 tablet (10 mg total) by mouth at bedtime.   Multiple Vitamin (MULTIVITAMIN WITH MINERALS) TABS tablet Take 1 tablet by mouth daily.   Omega-3 1000 MG CAPS Take 1 capsule (1,000 mg total) by mouth daily.       03/27/2022    9:27 AM 01/31/2022    3:45 PM 07/17/2021    9:52 AM 09/13/2020    7:57 AM  GAD 7 : Generalized Anxiety Score  Nervous, Anxious, on Edge 0 0 0 0  Control/stop worrying 0 0 0 0  Worry too much - different things 0 0 0 0  Trouble relaxing 0 0 0 0  Restless 0 0 0 0  Easily annoyed or irritable 0 0 0 0  Afraid - awful might happen 0 0 0 0  Total GAD 7 Score 0 0 0 0  Anxiety Difficulty Not difficult at all Not difficult at all Not difficult at all        03/27/2022    9:27 AM 01/31/2022    3:45 PM 07/17/2021    9:51 AM  Depression screen PHQ 2/9  Decreased Interest 0 0 0  Down, Depressed, Hopeless 0 0 0  PHQ - 2 Score 0 0 0  Altered sleeping 0 0 0  Tired, decreased energy 0 0 0  Change in appetite 0 0 0  Feeling bad or failure about yourself  0 0 0  Trouble concentrating 0 0 0  Moving slowly or fidgety/restless 0 0 0  Suicidal thoughts 0 0 0  PHQ-9 Score 0 0 0  Difficult doing work/chores Not difficult at all Not difficult at all Not difficult at all    BP Readings from Last 3 Encounters:  03/27/22 128/76  01/31/22 120/80  08/17/21 115/85    Physical Exam Vitals and nursing note reviewed.  HENT:     Head: Normocephalic.     Right Ear: External ear normal.     Left Ear: External ear normal.     Nose: Nose normal. No congestion or rhinorrhea.     Mouth/Throat:     Mouth:  Mucous membranes are moist.  Eyes:     General: No scleral icterus.       Right eye: No discharge.        Left eye: No discharge.     Conjunctiva/sclera: Conjunctivae normal.     Pupils: Pupils are equal, round, and reactive to light.  Neck:     Thyroid: No thyromegaly.     Vascular: No JVD.     Trachea: No tracheal deviation.  Cardiovascular:  Rate and Rhythm: Normal rate and regular rhythm.     Heart sounds: Normal heart sounds and S1 normal. No murmur heard.    No systolic murmur is present.     No diastolic murmur is present.     No friction rub. No gallop. No S3 or S4 sounds.  Pulmonary:     Effort: No respiratory distress.     Breath sounds: Normal breath sounds. No decreased breath sounds, wheezing, rhonchi or rales.  Abdominal:     General: Bowel sounds are normal.     Palpations: Abdomen is soft. There is no mass.     Tenderness: There is no abdominal tenderness. There is no guarding or rebound.  Musculoskeletal:        General: No tenderness. Normal range of motion.     Cervical back: Normal range of motion and neck supple.  Lymphadenopathy:     Cervical: No cervical adenopathy.  Skin:    General: Skin is warm.     Findings: No rash.  Neurological:     Mental Status: He is alert.     Deep Tendon Reflexes: Reflexes are normal and symmetric.     Reflex Scores:      Patellar reflexes are 2+ on the right side and 2+ on the left side.    Wt Readings from Last 3 Encounters:  03/27/22 178 lb (80.7 kg)  01/31/22 181 lb (82.1 kg)  08/17/21 165 lb (74.8 kg)    BP 128/76   Pulse 80   Ht _0  (1.702 m)   Wt 178 lb (80.7 kg)   SpO2 99%   BMI 27.88 kg/m   Assessment and Plan: 1. Essential hypertension Chronic.  Controlled.  Stable.  Blood pressure today is 128/76.  Asymptomatic.  Tolerating medications well.  Continue on losartan 100 mg once a day and metoprolol XL 25 mg once a day. - losartan (COZAAR) 100 MG tablet; Take 1 tablet (100 mg total) by mouth  daily.  Dispense: 30 tablet; Refill: 5 - metoprolol succinate (TOPROL-XL) 25 MG 24 hr tablet; TAKE 1 TABLET BY MOUTH ONCE DAILY DISCONTINUE  HCTZ  AND  ADD  METOPROLOL  TO  LOSARTAN  Dispense: 30 tablet; Refill: 5 - Comprehensive Metabolic Panel (CMET)  2. Hyperlipidemia, unspecified hyperlipidemia type Chronic.  Diet controlled.  Stable.  Currently is controlled on low-cholesterol diet and we will check lipid panel for current level of control. - montelukast (SINGULAIR) 10 MG tablet; Take 1 tablet (10 mg total) by mouth at bedtime.  Dispense: 30 tablet; Refill: 5 - Lipid Panel With LDL/HDL Ratio  3. Nevus New onset.  Year discoloration on the left cheek consistent with a nevus.  We will refer to dermatology for evaluation. - Ambulatory referral to Dermatology  4. Viral syndrome Chronic.  Episodic.  Patient takes on a daily basis for suppression acyclovir 200 mg once a day. - acyclovir (ZOVIRAX) 200 MG capsule; Take 1 capsule (200 mg total) by mouth daily.  Dispense: 30 capsule; Refill: 5     Otilio Miu, MD

## 2022-03-28 LAB — COMPREHENSIVE METABOLIC PANEL
ALT: 33 IU/L (ref 0–44)
AST: 33 IU/L (ref 0–40)
Albumin/Globulin Ratio: 2.2 (ref 1.2–2.2)
Albumin: 4.4 g/dL (ref 4.1–5.1)
Alkaline Phosphatase: 68 IU/L (ref 44–121)
BUN/Creatinine Ratio: 14 (ref 9–20)
BUN: 10 mg/dL (ref 6–24)
Bilirubin Total: 0.4 mg/dL (ref 0.0–1.2)
CO2: 24 mmol/L (ref 20–29)
Calcium: 9.5 mg/dL (ref 8.7–10.2)
Chloride: 100 mmol/L (ref 96–106)
Creatinine, Ser: 0.74 mg/dL — ABNORMAL LOW (ref 0.76–1.27)
Globulin, Total: 2 g/dL (ref 1.5–4.5)
Glucose: 90 mg/dL (ref 70–99)
Potassium: 4.6 mmol/L (ref 3.5–5.2)
Sodium: 139 mmol/L (ref 134–144)
Total Protein: 6.4 g/dL (ref 6.0–8.5)
eGFR: 112 mL/min/{1.73_m2} (ref >59)

## 2022-03-28 LAB — LIPID PANEL WITH LDL/HDL RATIO
Cholesterol, Total: 200 mg/dL — ABNORMAL HIGH (ref 100–199)
HDL: 83 mg/dL (ref >39)
LDL Chol Calc (NIH): 106 mg/dL — ABNORMAL HIGH (ref 0–99)
LDL/HDL Ratio: 1.3 ratio (ref 0.0–3.6)
Triglycerides: 60 mg/dL (ref 0–149)
VLDL Cholesterol Cal: 11 mg/dL (ref 5–40)

## 2022-04-11 ENCOUNTER — Ambulatory Visit: Payer: No Typology Code available for payment source | Admitting: Family Medicine

## 2022-04-11 ENCOUNTER — Ambulatory Visit: Payer: Self-pay | Admitting: *Deleted

## 2022-04-11 ENCOUNTER — Encounter: Payer: Self-pay | Admitting: Family Medicine

## 2022-04-11 VITALS — BP 118/80 | HR 92 | Temp 98.6°F | Ht 67.0 in | Wt 178.0 lb

## 2022-04-11 DIAGNOSIS — J329 Chronic sinusitis, unspecified: Secondary | ICD-10-CM

## 2022-04-11 DIAGNOSIS — J029 Acute pharyngitis, unspecified: Secondary | ICD-10-CM | POA: Insufficient documentation

## 2022-04-11 LAB — POC COVID19 BINAXNOW: SARS Coronavirus 2 Ag: NEGATIVE

## 2022-04-11 LAB — POCT INFLUENZA A/B
Influenza A, POC: NEGATIVE
Influenza B, POC: NEGATIVE

## 2022-04-11 MED ORDER — PROMETHAZINE-DM 6.25-15 MG/5ML PO SYRP: 5.0000 mL | ORAL_SOLUTION | Freq: Four times a day (QID) | ORAL | 0 refills | Status: DC | PRN

## 2022-04-11 MED ORDER — AZITHROMYCIN 250 MG PO TABS: ORAL_TABLET | ORAL | 0 refills | Status: AC

## 2022-04-11 NOTE — Progress Notes (Signed)
     Primary Care / Sports Medicine Office Visit  Patient Information:  Patient ID: Matthew Scarlata., male DOB: November 02, 1970 Age: 51 y.o. MRN: [id number]   Matthew Joynt. is a pleasant 51 y.o. male presenting with the following:  Chief Complaint  Patient presents with   Sinusitis    Started yesterday, sinus headache, runny nose sore throat.     Vitals:   04/11/22 1105  BP: 118/80  Pulse: 92  Temp: 98.6 F (37 C)  SpO2: 98%   Vitals:   04/11/22 1105  Weight: 178 lb (80.7 kg)  Height: '5\' 7"'$  (1.702 m)   Body mass index is 27.88 kg/m.  No results found.   Independent interpretation of notes and tests performed by another provider:   None  Procedures performed:   None  Pertinent History, Exam, Impression, and Recommendations:   Problem List Items Addressed This Visit       Other   Sore throat - Primary    1-2 day history of severe throat pain, congestion, left ear pain and facial pressure, left greater than right, mild SOA. Has been using Nyquil with subtle response.  Swabbed for flu and COVID, both negative, exam with tenderness about frontal, ethymoid, and maxillary regions on left, left TM with fluid posteriorly, swollen erythematous turbinates, oropharynx without exudates, lung fields clear.  Given findings plan for azithromycin, supportive care, Rx antitussive, follow-up PRN.  Prior visits in EMR reviewed.      Relevant Medications   azithromycin (ZITHROMAX) 250 MG tablet   promethazine-dextromethorphan (PROMETHAZINE-DM) 6.25-15 MG/5ML syrup     Orders & Medications Meds ordered this encounter  Medications   azithromycin (ZITHROMAX) 250 MG tablet    Sig: Take 2 tablets on day 1, then 1 tablet daily on days 2 through 5    Dispense:  6 tablet    Refill:  0   promethazine-dextromethorphan (PROMETHAZINE-DM) 6.25-15 MG/5ML syrup    Sig: Take 5 mLs by mouth 4 (four) times daily as needed for cough.    Dispense:  118 mL    Refill:  0   Orders  Placed This Encounter  Procedures   POC COVID-19   POCT Influenza A/B     No follow-ups on file.     Montel Culver, MD   Primary Care Sports Medicine [place]

## 2022-04-11 NOTE — Assessment & Plan Note (Addendum)
1-2 day history of severe throat pain, congestion, left ear pain and facial pressure, left greater than right, mild SOA. Has been using Nyquil with subtle response.  Swabbed for flu and COVID, both negative, exam with tenderness about frontal, ethymoid, and maxillary regions on left, left TM with fluid posteriorly, swollen erythematous turbinates, oropharynx without exudates, lung fields clear.  Given findings plan for azithromycin, supportive care, Rx antitussive, follow-up PRN.  Prior visits in EMR reviewed.

## 2022-04-11 NOTE — Telephone Encounter (Signed)
   Chief Complaint: sore throat- sinus symptoms  Symptoms: sore throat, sinus drainage, headache, feels warm- no fever  Frequency: symptoms started yesterday Pertinent Negatives: Patient denies   Disposition: '[]'$ ED /'[]'$ Urgent Care (no appt availability in office) / '[x]'$ Appointment(In office/virtual)/ '[]'$  [place] [facility]/ '[]'$ Home Care/ '[]'$ Refused Recommended Disposition /'[]'$ [place] [facility]/ '[]'$  Follow-up with PCP Additional Notes: Patient is requesting appointment for symptoms    Reason for Disposition  [1] Sore throat with cough/cold symptoms AND [2] present < 5 days  Answer Assessment - Initial Assessment Questions 1. ONSET: "When did the throat start hurting?" (Hours or days ago)      Yesterday evening 2. SEVERITY: "How bad is the sore throat?" (Scale 1-10; mild, moderate or severe)   - MILD (1-3):  Doesn't interfere with eating or normal activities.   - MODERATE (4-7): Interferes with eating some solids and normal activities.   - SEVERE (8-10):  Excruciating pain, interferes with most normal activities.   - SEVERE WITH DYSPHAGIA (10): Can't swallow liquids, drooling.     Mild/moderate 3. STREP EXPOSURE: "Has there been any exposure to strep within the past week?" If Yes, ask: "What type of contact occurred?"      No 4.  VIRAL SYMPTOMS: "Are there any symptoms of a cold, such as a runny nose, cough, hoarse voice or red eyes?"      Sinus drainage 5. FEVER: "Do you have a fever?" If Yes, ask: "What is your temperature, how was it measured, and when did it start?"     Feels warm 6. PUS ON THE TONSILS: "Is there pus on the tonsils in the back of your throat?"     No- red 7. OTHER SYMPTOMS: "Do you have any other symptoms?" (e.g., difficulty breathing, headache, rash)     Slight headache- sinus pressure  Protocols used: Sore Throat-A-AH

## 2022-04-11 NOTE — Telephone Encounter (Signed)
Summary: sore throat/congestion   Pt has sore throat, congestion, slight fever, headache. Wanted appt today.  None available.  Please advise      Called patient to review sx headache, sore throat, congestion fever. No answer, LVMTCB [phone].

## 2022-06-17 IMAGING — CR DG SINUSES COMPLETE 3+V
4 series · 4 of 4 positions shown · non-contrast
Comparison: None.

CLINICAL DATA: recurrent left maxillary pain/congestion

EXAM:
PARANASAL SINUSES - COMPLETE 3 + VIEW

[sinuses waters]
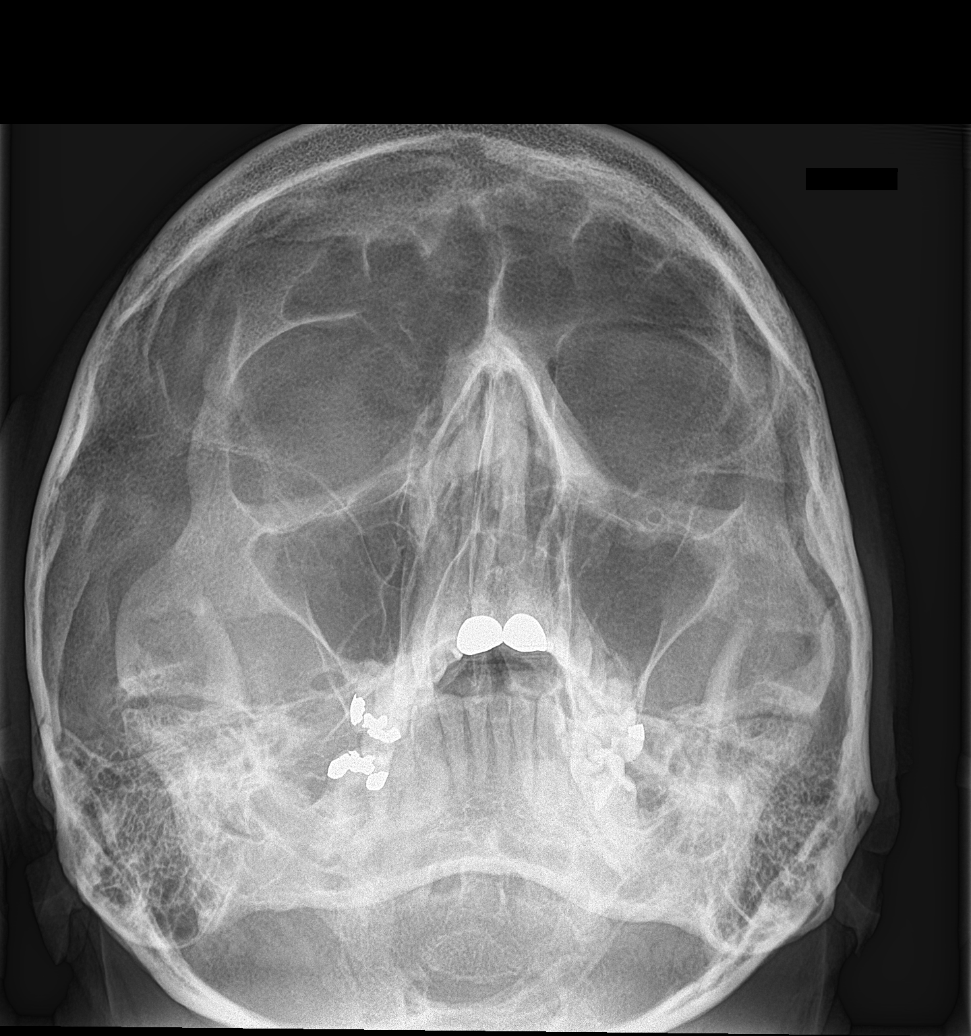

[sinuses pa]
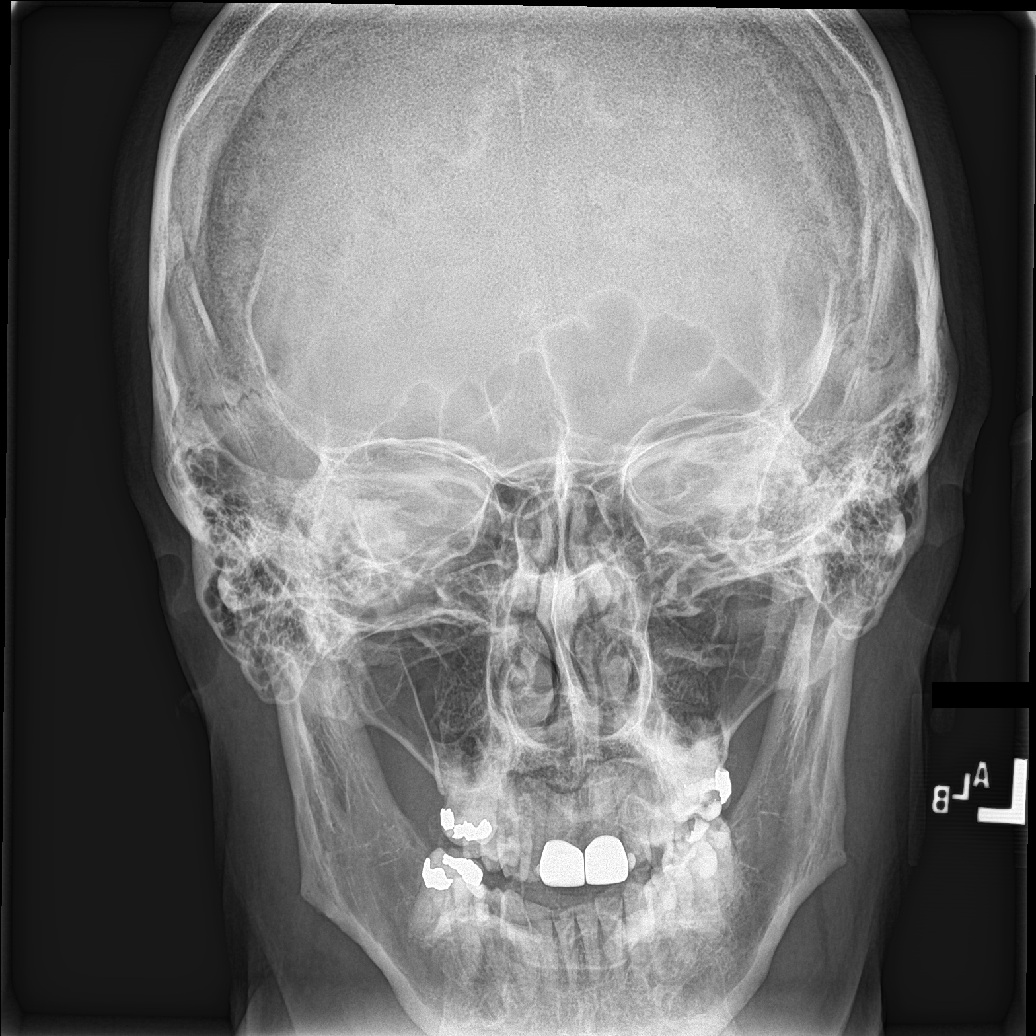

[sinuses lat]
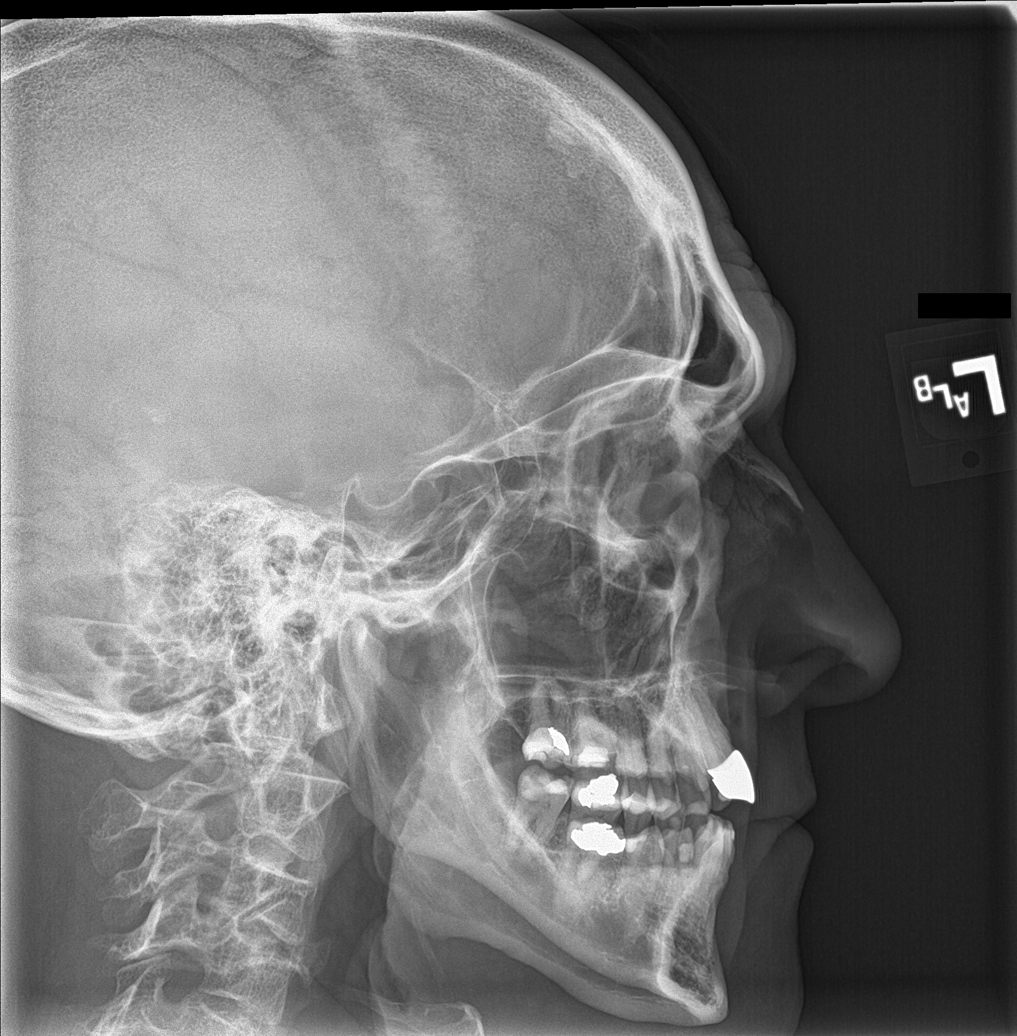

[skull smv]
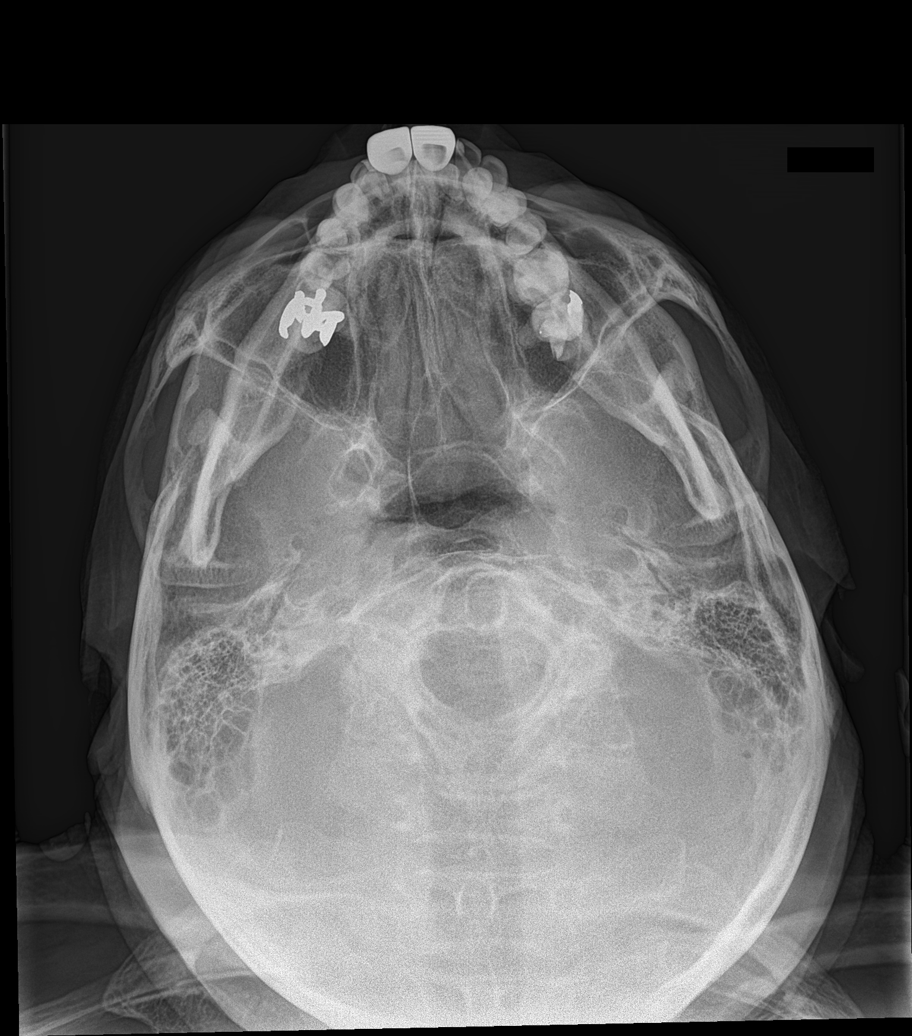

[4 of 4 positions shown; findings below may reference images not displayed]

FINDINGS: The paranasal sinus are aerated. There is no evidence of sinus
opacification air-fluid levels or mucosal thickening. No significant
bone abnormalities are seen.
IMPRESSION: Negative sinus radiographs. Consider CT if patient has persistent
symptoms.

## 2022-09-25 ENCOUNTER — Ambulatory Visit (INDEPENDENT_AMBULATORY_CARE_PROVIDER_SITE_OTHER): Payer: No Typology Code available for payment source | Admitting: Family Medicine

## 2022-09-25 ENCOUNTER — Encounter: Payer: Self-pay | Admitting: Family Medicine

## 2022-09-25 VITALS — BP 120/76 | HR 84 | Ht 67.0 in | Wt 171.0 lb

## 2022-09-25 DIAGNOSIS — K219 Gastro-esophageal reflux disease without esophagitis: Secondary | ICD-10-CM

## 2022-09-25 DIAGNOSIS — I10 Essential (primary) hypertension: Secondary | ICD-10-CM | POA: Diagnosis not present

## 2022-09-25 DIAGNOSIS — E785 Hyperlipidemia, unspecified: Secondary | ICD-10-CM | POA: Diagnosis not present

## 2022-09-25 DIAGNOSIS — B349 Viral infection, unspecified: Secondary | ICD-10-CM | POA: Diagnosis not present

## 2022-09-25 MED ORDER — METOPROLOL SUCCINATE ER 25 MG PO TB24: ORAL_TABLET | ORAL | 5 refills | Status: DC

## 2022-09-25 MED ORDER — ACYCLOVIR 200 MG PO CAPS: 200.0000 mg | ORAL_CAPSULE | Freq: Every day | ORAL | 5 refills | Status: DC

## 2022-09-25 MED ORDER — LOSARTAN POTASSIUM 100 MG PO TABS: 100.0000 mg | ORAL_TABLET | Freq: Every day | ORAL | 5 refills | Status: DC

## 2022-09-25 MED ORDER — MONTELUKAST SODIUM 10 MG PO TABS
10.0000 mg | ORAL_TABLET | Freq: Every day | ORAL | 5 refills | Status: AC
Start: 2022-09-25 — End: ?

## 2022-09-25 NOTE — Progress Notes (Signed)
Date:  09/25/2022   Name:  Rixton Lieb.   DOB:  [birth year]   MRN:  [id number]   Chief Complaint: Hypertension, Allergic Rhinitis , and viral syndrome  Hypertension This is a chronic problem. The current episode started more than 1 year ago. The problem has been gradually improving since onset. The problem is controlled. Pertinent negatives include no chest pain, headaches, orthopnea, palpitations, peripheral edema, PND or shortness of breath. There are no associated agents to hypertension. Risk factors for coronary artery disease include dyslipidemia. Past treatments include calcium channel blockers and beta blockers. The current treatment provides moderate improvement. There are no compliance problems.  There is no history of angina, kidney disease, CAD/MI or CVA. There is no history of chronic renal disease, a hypertension causing med or renovascular disease.    Lab Results  Component Value Date   NA 139 03/27/2022   K 4.6 03/27/2022   CO2 24 03/27/2022   GLUCOSE 90 03/27/2022   BUN 10 03/27/2022   CREATININE 0.74 (L) 03/27/2022   CALCIUM 9.5 03/27/2022   EGFR 112 03/27/2022   GFRNONAA 103 02/06/2019   Lab Results  Component Value Date   CHOL 200 (H) 03/27/2022   HDL 83 03/27/2022   LDLCALC 106 (H) 03/27/2022   TRIG 60 03/27/2022   CHOLHDL 2.4 02/06/2019   No results found for: "TSH" No results found for: "HGBA1C" Lab Results  Component Value Date   WBC 7.8 01/31/2022   HGB 13.4 01/31/2022   HCT 37.0 (L) 01/31/2022   MCV 94.1 01/31/2022   PLT 225 01/31/2022   Lab Results  Component Value Date   ALT 33 03/27/2022   AST 33 03/27/2022   ALKPHOS 68 03/27/2022   BILITOT 0.4 03/27/2022   No results found for: "25OHVITD2", "25OHVITD3", "VD25OH"   Review of Systems  Constitutional:  Negative for diaphoresis and fever.  HENT:  Negative for congestion.   Eyes:  Negative for visual disturbance.  Respiratory:  Negative for cough, chest tightness, shortness of  breath and wheezing.   Cardiovascular:  Negative for chest pain, palpitations, orthopnea and PND.  Gastrointestinal:  Negative for abdominal distention.  Endocrine: Negative for polydipsia and polyuria.  Neurological:  Negative for headaches.    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   Alcohol intoxication (HCC) 04/22/2016   Pneumonia 04/22/2016   Hypertension 12/02/2015   GERD (gastroesophageal reflux disease) 12/02/2015   Viral syndrome 12/02/2015    No Known Allergies  Past Surgical History:  Procedure Laterality Date   COLONOSCOPY WITH PROPOFOL N/A 08/17/2021   Procedure: COLONOSCOPY WITH PROPOFOL;  Surgeon: Midge Minium, MD;  Location: [facility];  Service: Endoscopy;  Laterality: N/A;   POLYPECTOMY  08/17/2021   Procedure: POLYPECTOMY;  Surgeon: Midge Minium, MD;  Location: [facility];  Service: Endoscopy;;    Social History   Tobacco Use   Smoking status: Never   Smokeless tobacco: Current    Types: Snuff  Substance Use Topics   Alcohol use: Yes    Alcohol/week: 6.0 standard drinks of alcohol    Types: 6 Cans of beer per week    Comment: daily   Drug use: Never     Medication list has been reviewed and updated.  Current Meds  Medication Sig   acyclovir (ZOVIRAX) 200 MG  capsule Take 1 capsule (200 mg total) by mouth daily.   esomeprazole (NEXIUM) 40 MG capsule Take 1 capsule (40 mg total) by mouth daily at 12 noon. otc   losartan (COZAAR) 100 MG tablet Take 1 tablet (100 mg total) by mouth daily.   metoprolol succinate (TOPROL-XL) 25 MG 24 hr tablet TAKE 1 TABLET BY MOUTH ONCE DAILY DISCONTINUE  HCTZ  AND  ADD  METOPROLOL  TO  LOSARTAN   milk thistle 175 MG tablet Take 175 mg by mouth daily.   montelukast (SINGULAIR) 10 MG tablet Take 1 tablet (10 mg total) by mouth at bedtime.   Multiple Vitamin  (MULTIVITAMIN WITH MINERALS) TABS tablet Take 1 tablet by mouth daily.   Omega-3 1000 MG CAPS Take 1 capsule (1,000 mg total) by mouth daily.       09/25/2022    8:57 AM 03/27/2022    9:27 AM 01/31/2022    3:45 PM 07/17/2021    9:52 AM  GAD 7 : Generalized Anxiety Score  Nervous, Anxious, on Edge 0 0 0 0  Control/stop worrying 0 0 0 0  Worry too much - different things 0 0 0 0  Trouble relaxing 0 0 0 0  Restless 0 0 0 0  Easily annoyed or irritable 0 0 0 0  Afraid - awful might happen 0 0 0 0  Total GAD 7 Score 0 0 0 0  Anxiety Difficulty Not difficult at all Not difficult at all Not difficult at all Not difficult at all       09/25/2022    8:57 AM 03/27/2022    9:27 AM 01/31/2022    3:45 PM  Depression screen PHQ 2/9  Decreased Interest 0 0 0  Down, Depressed, Hopeless 0 0 0  PHQ - 2 Score 0 0 0  Altered sleeping 0 0 0  Tired, decreased energy 0 0 0  Change in appetite 0 0 0  Feeling bad or failure about yourself  0 0 0  Trouble concentrating 0 0 0  Moving slowly or fidgety/restless 0 0 0  Suicidal thoughts 0 0 0  PHQ-9 Score 0 0 0  Difficult doing work/chores Not difficult at all Not difficult at all Not difficult at all    BP Readings from Last 3 Encounters:  09/25/22 120/76  04/11/22 118/80  03/27/22 128/76    Physical Exam Vitals and nursing note reviewed.  HENT:     Head: Normocephalic.     Right Ear: External ear normal.     Left Ear: External ear normal.     Nose: Nose normal.  Eyes:     General: No scleral icterus.       Right eye: No discharge.        Left eye: No discharge.     Conjunctiva/sclera: Conjunctivae normal.     Pupils: Pupils are equal, round, and reactive to light.  Neck:     Thyroid: No thyromegaly.     Vascular: No JVD.     Trachea: No tracheal deviation.  Cardiovascular:     Rate and Rhythm: Normal rate and regular rhythm.     Heart sounds: Normal heart sounds. No murmur heard.    No friction rub. No gallop.  Pulmonary:      Effort: No respiratory distress.     Breath sounds: Normal breath sounds. No wheezing or rales.  Abdominal:     General: Bowel sounds are normal.     Palpations: Abdomen is soft. There is no  mass.     Tenderness: There is no abdominal tenderness. There is no guarding or rebound.  Musculoskeletal:        General: No tenderness. Normal range of motion.     Cervical back: Neck supple.  Lymphadenopathy:     Cervical: No cervical adenopathy.  Skin:    General: Skin is warm.     Findings: No rash.  Neurological:     Mental Status: He is alert.     Wt Readings from Last 3 Encounters:  09/25/22 171 lb (77.6 kg)  04/11/22 178 lb (80.7 kg)  03/27/22 178 lb (80.7 kg)    BP 120/76   Pulse 84   Ht 5\' 7"  (1.702 m)   Wt 171 lb (77.6 kg)   SpO2 97%   BMI 26.78 kg/m   Assessment and Plan:  1. Essential hypertension Chronic.  Controlled.  Stable.  Blood pressure 120/76.  Asymptomatic.  Tolerating medications well.  Continue losartan 100 mg once a day and metoprolol XL 25 mg once a day.  Will check renal function panel for electrolytes and GFR.  Will recheck patient in 6 months - losartan (COZAAR) 100 MG tablet; Take 1 tablet (100 mg total) by mouth daily.  Dispense: 30 tablet; Refill: 5 - metoprolol succinate (TOPROL-XL) 25 MG 24 hr tablet; TAKE 1 TABLET BY MOUTH ONCE DAILY DISCONTINUE  HCTZ  AND  ADD  METOPROLOL  TO  LOSARTAN  Dispense: 30 tablet; Refill: 5 - Renal Function Panel  2. Gastroesophageal reflux disease Chronic.  Controlled.  Stable.  Patient is using Nexium 40 mg once a day over-the-counter for control.  3. Hyperlipidemia, unspecified hyperlipidemia type Chronic.  Controlled.  Stable.  Continue omega-3 capsule daily.  Will check lipid panel for current level of control.  - Lipid Panel With LDL/HDL Ratio  4. Viral syndrome Chronic.  Controlled.  Stable.  Continue acyclovir for suppression 200 mg acyclovir daily.  Will recheck in 6 months. - acyclovir (ZOVIRAX) 200 MG  capsule; Take 1 capsule (200 mg total) by mouth daily.  Dispense: 30 capsule; Refill: 5    Elizabeth Sauer, MD

## 2022-09-26 LAB — LIPID PANEL WITH LDL/HDL RATIO
Cholesterol, Total: 237 mg/dL — ABNORMAL HIGH (ref 100–199)
HDL: 101 mg/dL (ref >39)
LDL Chol Calc (NIH): 128 mg/dL — ABNORMAL HIGH (ref 0–99)
LDL/HDL Ratio: 1.3 ratio (ref 0.0–3.6)
Triglycerides: 51 mg/dL (ref 0–149)
VLDL Cholesterol Cal: 8 mg/dL (ref 5–40)

## 2022-09-26 LAB — RENAL FUNCTION PANEL
Albumin: 4.6 g/dL (ref 4.1–5.1)
BUN/Creatinine Ratio: 13 (ref 9–20)
BUN: 9 mg/dL (ref 6–24)
CO2: 22 mmol/L (ref 20–29)
Calcium: 9.8 mg/dL (ref 8.7–10.2)
Chloride: 98 mmol/L (ref 96–106)
Creatinine, Ser: 0.67 mg/dL — ABNORMAL LOW (ref 0.76–1.27)
Glucose: 103 mg/dL — ABNORMAL HIGH (ref 70–99)
Phosphorus: 4 mg/dL (ref 2.8–4.1)
Potassium: 4.7 mmol/L (ref 3.5–5.2)
Sodium: 137 mmol/L (ref 134–144)
eGFR: 114 mL/min/{1.73_m2} (ref >59)

## 2022-11-28 ENCOUNTER — Other Ambulatory Visit: Payer: Self-pay | Admitting: Family Medicine

## 2022-11-28 DIAGNOSIS — I10 Essential (primary) hypertension: Secondary | ICD-10-CM

## 2022-11-29 NOTE — Telephone Encounter (Signed)
Requested Prescriptions  Pending Prescriptions Disp Refills   metoprolol succinate (TOPROL-XL) 25 MG 24 hr tablet [Pharmacy Med Name: Metoprolol Succinate 25mg  Extended-Release Tablet] 30 tablet 1    Sig: Take 1 tablet by mouth once daily. Discontinue hydrochlorothiazide and add metoprolol to losartan.     Cardiovascular:  Beta Blockers Passed - 11/28/2022  2:37 PM      Passed - Last BP in normal range    BP Readings from Last 1 Encounters:  09/25/22 120/76         Passed - Last Heart Rate in normal range    Pulse Readings from Last 1 Encounters:  09/25/22 84         Passed - Valid encounter within last 6 months    Recent Outpatient Visits           2 months ago Essential hypertension   [place] [facility]   7 months ago Rhinosinusitis   [facility], Ocie Bob, MD   8 months ago Essential hypertension   [place] [facility], MD   10 months ago Diarrhea, unspecified type   [facility], MD   1 year ago Fever and chills   [place] [facility], MD       Future Appointments             In 3 months Duanne Limerick, MD [facility] at [facility], [facility]

## 2023-01-21 ENCOUNTER — Other Ambulatory Visit: Payer: Self-pay | Admitting: Family Medicine

## 2023-01-21 DIAGNOSIS — I10 Essential (primary) hypertension: Secondary | ICD-10-CM

## 2023-02-20 ENCOUNTER — Other Ambulatory Visit: Payer: Self-pay | Admitting: Family Medicine

## 2023-02-20 DIAGNOSIS — I10 Essential (primary) hypertension: Secondary | ICD-10-CM

## 2023-02-20 DIAGNOSIS — B349 Viral infection, unspecified: Secondary | ICD-10-CM

## 2023-03-22 ENCOUNTER — Other Ambulatory Visit: Payer: Self-pay | Admitting: Family Medicine

## 2023-03-22 DIAGNOSIS — I10 Essential (primary) hypertension: Secondary | ICD-10-CM

## 2023-03-22 NOTE — Telephone Encounter (Signed)
Requested Prescriptions  Pending Prescriptions Disp Refills   metoprolol succinate (TOPROL-XL) 25 MG 24 hr tablet [Pharmacy Med Name: Metoprolol Succinate 25mg  Extended-Release Tablet] 90 tablet 0    Sig: Take 1 tablet by mouth once daily. Discontinue hydrochlorothiazide and add metoprolol to losartan.     Cardiovascular:  Beta Blockers Passed - 03/22/2023  2:30 PM      Passed - Last BP in normal range    BP Readings from Last 1 Encounters:  09/25/22 120/76         Passed - Last Heart Rate in normal range    Pulse Readings from Last 1 Encounters:  09/25/22 84         Passed - Valid encounter within last 6 months    Recent Outpatient Visits           5 months ago Essential hypertension   [place] [facility], MD   11 months ago Rhinosinusitis   [facility], Ocie Bob, MD   12 months ago Essential hypertension   [place] [facility], MD   1 year ago Diarrhea, unspecified type   [facility], MD   1 year ago Fever and chills   [place] [facility], MD       Future Appointments             In 6 days Duanne Limerick, MD [facility] at [facility], [facility]

## 2023-03-28 ENCOUNTER — Ambulatory Visit (INDEPENDENT_AMBULATORY_CARE_PROVIDER_SITE_OTHER): Payer: No Typology Code available for payment source | Admitting: Family Medicine

## 2023-03-28 ENCOUNTER — Encounter: Payer: Self-pay | Admitting: Family Medicine

## 2023-03-28 ENCOUNTER — Ambulatory Visit
Admission: RE | Admit: 2023-03-28 | Discharge: 2023-03-28 | Disposition: A | Discharge status: Home / Self Care | Payer: No Typology Code available for payment source | Attending: Family Medicine | Admitting: Family Medicine

## 2023-03-28 ENCOUNTER — Ambulatory Visit
Admission: RE | Admit: 2023-03-28 | Discharge: 2023-03-28 | Disposition: A | Discharge status: Home / Self Care | Payer: No Typology Code available for payment source | Source: Ambulatory Visit | Attending: Family Medicine | Admitting: Family Medicine

## 2023-03-28 VITALS — BP 118/70 | HR 71 | Ht 67.0 in | Wt 171.0 lb

## 2023-03-28 DIAGNOSIS — K219 Gastro-esophageal reflux disease without esophagitis: Secondary | ICD-10-CM | POA: Diagnosis not present

## 2023-03-28 DIAGNOSIS — I10 Essential (primary) hypertension: Secondary | ICD-10-CM

## 2023-03-28 DIAGNOSIS — M545 Low back pain, unspecified: Secondary | ICD-10-CM | POA: Insufficient documentation

## 2023-03-28 MED ORDER — ESOMEPRAZOLE MAGNESIUM 40 MG PO CPDR
40.0000 mg | DELAYED_RELEASE_CAPSULE | Freq: Every day | ORAL | 5 refills | Status: AC
Start: 2023-03-28 — End: ?

## 2023-03-28 MED ORDER — METOPROLOL SUCCINATE ER 25 MG PO TB24
ORAL_TABLET | ORAL | 5 refills | Status: DC
Start: 2023-03-28 — End: 2023-09-12

## 2023-03-28 MED ORDER — LOSARTAN POTASSIUM 100 MG PO TABS: 100.0000 mg | ORAL_TABLET | Freq: Every day | ORAL | 5 refills | Status: DC

## 2023-03-28 NOTE — Progress Notes (Addendum)
Date:  03/28/2023   Name:  Matthew Zuniga.   DOB:  [birth year]   MRN:  [id number]   Chief Complaint: Hypertension and Gastroesophageal Reflux      Hypertension This is a chronic problem. The current episode started more than 1 year ago. The problem has been gradually improving since onset. The problem is controlled. Pertinent negatives include no anxiety, blurred vision, chest pain, headaches, malaise/fatigue, neck pain, orthopnea, palpitations, peripheral edema, PND, shortness of breath or sweats. There are no associated agents to hypertension. Risk factors for coronary artery disease include dyslipidemia. Past treatments include ACE inhibitors and diuretics. The current treatment provides moderate improvement. There are no compliance problems.  There is no history of angina, CAD/MI, CVA or PVD. There is no history of chronic renal disease, a hypertension causing med or renovascular disease.  Gastroesophageal Reflux He reports no belching, no chest pain, no choking, no coughing, no dysphagia, no globus sensation, no heartburn, no nausea, no sore throat or no wheezing. This is a chronic problem. The current episode started more than 1 year ago. The problem has been gradually improving. Pertinent negatives include no anemia, fatigue, melena, muscle weakness, orthopnea or weight loss. There are no known risk factors. He has tried a PPI for the symptoms. The treatment provided moderate relief.  Back Pain This is a chronic problem. The current episode started more than 1 year ago. The problem occurs daily. The problem has been waxing and waning since onset. The pain is present in the lumbar spine. The quality of the pain is described as aching. The pain does not radiate. Pertinent negatives include no bladder incontinence, bowel incontinence, chest pain, headaches, paresthesias, tingling, weakness or weight loss.    Lab Results  Component Value Date   NA 137 09/25/2022   K 4.7 09/25/2022    CO2 22 09/25/2022   GLUCOSE 103 (H) 09/25/2022   BUN 9 09/25/2022   CREATININE 0.67 (L) 09/25/2022   CALCIUM 9.8 09/25/2022   EGFR 114 09/25/2022   GFRNONAA 103 02/06/2019   Lab Results  Component Value Date   CHOL 237 (H) 09/25/2022   HDL 101 09/25/2022   LDLCALC 128 (H) 09/25/2022   TRIG 51 09/25/2022   CHOLHDL 2.4 02/06/2019   No results found for: "TSH" No results found for: "HGBA1C" Lab Results  Component Value Date   WBC 7.8 01/31/2022   HGB 13.4 01/31/2022   HCT 37.0 (L) 01/31/2022   MCV 94.1 01/31/2022   PLT 225 01/31/2022   Lab Results  Component Value Date   ALT 33 03/27/2022   AST 33 03/27/2022   ALKPHOS 68 03/27/2022   BILITOT 0.4 03/27/2022   No results found for: "25OHVITD2", "25OHVITD3", "VD25OH"   Review of Systems  Constitutional:  Negative for fatigue, malaise/fatigue and weight loss.  HENT:  Negative for congestion, mouth sores and sore throat.   Eyes:  Negative for blurred vision.  Respiratory:  Negative for cough, choking, shortness of breath and wheezing.   Cardiovascular:  Negative for chest pain, palpitations, orthopnea and PND.  Gastrointestinal:  Negative for bowel incontinence, dysphagia, heartburn, melena and nausea.  Genitourinary:  Negative for bladder incontinence.  Musculoskeletal:  Positive for back pain. Negative for muscle weakness and neck pain.  Neurological:  Negative for tingling, weakness, headaches and paresthesias.    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;  Service: Endoscopy;;    Social History   Tobacco Use   Smoking status: Never   Smokeless tobacco: Current    Types: Snuff  Substance Use Topics   Alcohol use: Yes    Alcohol/week: 6.0 standard drinks of alcohol    Types: 6 Cans of beer per week    Comment: daily   Drug use: Never     Medication list has been reviewed and updated.  Current Meds  Medication Sig   acyclovir (ZOVIRAX) 200 MG capsule Take 1 capsule by mouth daily.   esomeprazole (NEXIUM) 40 MG capsule Take 1 capsule (40 mg total) by mouth daily at 12 noon. otc   losartan (COZAAR) 100 MG tablet Take 1 tablet by mouth once daily.   metoprolol succinate (TOPROL-XL) 25 MG 24 hr tablet Take 1 tablet by mouth once daily. Discontinue hydrochlorothiazide and add metoprolol to losartan.   milk thistle 175 MG tablet Take 175 mg by mouth daily.   montelukast (SINGULAIR) 10 MG tablet Take 1 tablet (10 mg total) by mouth at bedtime.   Multiple Vitamin (MULTIVITAMIN WITH MINERALS) TABS tablet Take 1 tablet by mouth daily.   Omega-3 1000 MG CAPS Take 1 capsule (1,000 mg total) by mouth daily.       03/28/2023    8:45 AM 09/25/2022    8:57 AM 03/27/2022    9:27 AM 01/31/2022    3:45 PM  GAD 7 : Generalized Anxiety Score  Nervous, Anxious, on Edge 0 0 0 0  Control/stop worrying 0 0 0 0  Worry too much - different things 0 0 0 0  Trouble relaxing 0 0 0 0  Restless 0 0 0 0  Easily annoyed or irritable 0 0 0 0  Afraid - awful might happen 0 0 0 0  Total GAD 7 Score 0 0 0 0  Anxiety Difficulty Not difficult at all Not difficult at all Not difficult at all Not difficult at all       03/28/2023    8:45 AM 09/25/2022    8:57 AM 03/27/2022    9:27 AM  Depression screen PHQ 2/9  Decreased Interest 0 0 0  Down,  Depressed, Hopeless 0 0 0  PHQ - 2 Score 0 0 0  Altered sleeping 0 0 0  Tired, decreased energy 0 0 0  Change in appetite 0 0 0  Feeling bad or failure about yourself  0 0 0  Trouble concentrating 0 0 0  Moving slowly or fidgety/restless 0 0 0  Suicidal thoughts 0 0 0  PHQ-9 Score 0 0 0  Difficult doing work/chores Not difficult at all Not difficult at all Not difficult at all    BP Readings from Last 3 Encounters:  03/28/23 118/70  09/25/22 120/76  04/11/22 118/80    Physical Exam Vitals and nursing note reviewed.  HENT:     Head: Normocephalic.     Right Ear:  Tympanic membrane and external ear normal.     Left Ear: Tympanic membrane and external ear normal.     Nose: Nose normal. No congestion or rhinorrhea.     Mouth/Throat:     Mouth: Mucous membranes are moist.  Eyes:     General: No scleral icterus.       Right eye: No discharge.        Left eye: No discharge.     Conjunctiva/sclera: Conjunctivae normal.     Pupils: Pupils are equal, round, and reactive to light.  Neck:     Thyroid: No thyromegaly.     Vascular: No JVD.     Trachea: No tracheal deviation.  Cardiovascular:     Rate and Rhythm: Normal rate and regular rhythm.     Heart sounds: Normal heart sounds. No murmur heard.    No friction rub. No gallop.  Pulmonary:     Effort: No respiratory distress.     Breath sounds: Normal breath sounds. No wheezing, rhonchi or rales.  Abdominal:     General: Bowel sounds are normal.     Palpations: Abdomen is soft. There is no mass.     Tenderness: There is no abdominal tenderness. There is no guarding or rebound.  Musculoskeletal:        General: No tenderness.     Cervical back: Normal range of motion and neck supple.     Lumbar back: Spasms present. No tenderness or bony tenderness. Positive right straight leg raise test. Negative left straight leg raise test.  Lymphadenopathy:     Cervical: No cervical adenopathy.  Skin:    General: Skin is warm.      Findings: No rash.  Neurological:     Mental Status: He is alert and oriented to person, place, and time.     Cranial Nerves: No cranial nerve deficit.     Sensory: Sensation is intact.     Motor: Motor function is intact.     Deep Tendon Reflexes: Reflexes are normal and symmetric.     Reflex Scores:      Patellar reflexes are 2+ on the right side and 2+ on the left side.    Wt Readings from Last 3 Encounters:  03/28/23 171 lb (77.6 kg)  09/25/22 171 lb (77.6 kg)  04/11/22 178 lb (80.7 kg)    BP 118/70   Pulse 71   Ht 5\' 7"  (1.702 m)   Wt 171 lb (77.6 kg)   SpO2 99%   BMI 26.78 kg/m   Assessment and Plan: 1. Essential hypertension Chronic.  Controlled.  Stable.  Blood pressure is 118/70.  Asymptomatic.  Tolerating medication at current dosing well.  Continue losartan 100 mg once a day and metoprolol XL 25 mg once a day.  Will check renal function panel for electrolytes and GFR.  Will recheck patient in 6 months. - losartan (COZAAR) 100 MG tablet; Take 1 tablet (100 mg total) by mouth daily.  Dispense: 30 tablet; Refill: 5 - metoprolol succinate (TOPROL-XL) 25 MG 24 hr tablet; Take 1 tablet by mouth once daily. Discontinue hydrochlorothiazide and add metoprolol to losartan.  Dispense: 30 tablet; Refill: 5 - Renal Function Panel  2. Gastroesophageal reflux disease Chronic.  Controlled.  Stable.  Continue Nexium 40 mg once a day.  Will recheck on an as-needed basis. - esomeprazole (NEXIUM) 40 MG capsule; Take 1 capsule (40 mg total) by mouth daily at 12 noon. otc  Dispense: 30 capsule; Refill: 5  3. Lumbar  pain New onset.  Patient has been seen chiropractor for several years and chiropractor mention to have his PCP get an MRI.  I told him that this is not quite as simple as that and that we would first have to start with a documented lumbar spine x-ray that we have in possession did look and then again it still would not be a possibility that primary care and we will discuss this  with sports medicine whether or not anything shows up on x-ray and needed sports medicine versus orthopedic/neurosurgery involved. - DG Lumbar Spine Complete   Review of LS spine notes just minimal degenerative disc disease with no misalignment nor loss of disc space.  I will run this by sports medicine to see if they need to be involved and whether or not we need to proceed with further imaging evaluation or referral to orthopedic neurosurgery  Elizabeth Sauer, MD

## 2023-03-29 LAB — RENAL FUNCTION PANEL
Albumin: 4.7 g/dL (ref 4.1–5.1)
BUN/Creatinine Ratio: 14 (ref 9–20)
BUN: 11 mg/dL (ref 6–24)
CO2: 26 mmol/L (ref 20–29)
Calcium: 9.6 mg/dL (ref 8.7–10.2)
Chloride: 99 mmol/L (ref 96–106)
Creatinine, Ser: 0.77 mg/dL (ref 0.76–1.27)
Glucose: 100 mg/dL — ABNORMAL HIGH (ref 70–99)
Phosphorus: 3.2 mg/dL (ref 2.8–4.1)
Potassium: 4.5 mmol/L (ref 3.5–5.2)
Sodium: 138 mmol/L (ref 134–144)
eGFR: 110 mL/min/{1.73_m2} (ref >59)

## 2023-04-02 ENCOUNTER — Ambulatory Visit: Payer: No Typology Code available for payment source | Admitting: Family Medicine

## 2023-04-02 ENCOUNTER — Encounter: Payer: Self-pay | Admitting: Family Medicine

## 2023-04-02 VITALS — BP 118/74 | HR 86 | Ht 67.0 in | Wt 171.0 lb

## 2023-04-02 DIAGNOSIS — M47815 Spondylosis without myelopathy or radiculopathy, thoracolumbar region: Secondary | ICD-10-CM | POA: Diagnosis not present

## 2023-04-02 MED ORDER — DICLOFENAC SODIUM 75 MG PO TBEC: 75.0000 mg | DELAYED_RELEASE_TABLET | Freq: Two times a day (BID) | ORAL | 0 refills | Status: DC

## 2023-04-02 MED ORDER — METHOCARBAMOL 500 MG PO TABS: 500.0000 mg | ORAL_TABLET | Freq: Every evening | ORAL | 0 refills | Status: DC | PRN

## 2023-04-02 NOTE — Progress Notes (Signed)
     Primary Care / Sports Medicine Office Visit  Patient Information:  Patient ID: Matthew Diab., male DOB: 1970/10/14 Age: 52 y.o. MRN: [id number]   Matthew Wernicke. is a pleasant 52 y.o. male presenting with the following:  Chief Complaint  Patient presents with   Back Pain    Started a few years ago. No injury. No falls. Had Xrays done last week. Hurts between shoulder blades and down into lumbar back. Hurts to sit, stand, and twist.    Vitals:   04/02/23 0947  BP: 118/74  Pulse: 86  SpO2: 97%   Vitals:   04/02/23 0947  Weight: 171 lb (77.6 kg)  Height: 5\' 7"  (1.702 m)   Body mass index is 26.78 kg/m.     Independent interpretation of notes and tests performed by another provider:   Independent interpretation of lumbar spine does demonstrate subtle intervertebral space narrowing and facet hypertrophy at L5-S1, limited visualization of the lower thoracic spine does demonstrate intervertebral narrowing and osteophytosis, no acute osseous processes identified.  Procedures performed:   None  Pertinent History, Exam, Impression, and Recommendations:   Problem List Items Addressed This Visit       Musculoskeletal and Integument   Spondylosis of thoracolumbar spine - Primary    Chronic, ongoing for roughly 2+ years, atraumatic in onset.  Has been routinely seeing chiropractor for symptom control, no medications.  Denies any radiation into the extremities, no bowel/bladder involvement.  Pain from mid back to low back, most aggravated by prolonged walking, particularly uneven surfaces, prolonged sitting, twisting, etc.  Examination with negative straight leg raise bilaterally, FADIR and resisted hip flexion on right localized to right groin, otherwise FABER and piriformis testing bilaterally benign, tenderness to the paraspinal thoracolumbar regions, Kemps test positive localizing right lower thoracic.  X-rays were reviewed independently with the patient  today.  Findings are most consistent with degenerative changes noted throughout the thoracolumbar spine, no red flag symptoms noted, reviewed various treatment strategies.  Plan: For the next 2 weeks: - Twice daily diclofenac (NSAID), take with food - Nightly methocarbamol (muscle relaxer), side effect can be drowsiness After 2 weeks: - Both medications go to as-needed dosing (take if there is any signs of discomfort) - Start exercises end of this week and continue until follow-up - Follow-up in 6 weeks - Contact for any questions      Relevant Medications   diclofenac (VOLTAREN) 75 MG EC tablet   methocarbamol (ROBAXIN) 500 MG tablet     Orders & Medications Medications:  Meds ordered this encounter  Medications   diclofenac (VOLTAREN) 75 MG EC tablet    Sig: Take 1 tablet (75 mg total) by mouth 2 (two) times daily.    Dispense:  120 tablet    Refill:  0   methocarbamol (ROBAXIN) 500 MG tablet    Sig: Take 1 tablet (500 mg total) by mouth at bedtime as needed for muscle spasms.    Dispense:  60 tablet    Refill:  0   No orders of the defined types were placed in this encounter.    Return in about 6 weeks (around 05/14/2023).     Jerrol Banana, MD, [facility]   Primary Care Sports Medicine Primary Care and Sports Medicine at [facility]

## 2023-04-02 NOTE — Patient Instructions (Addendum)
For the next 2 weeks: - Twice daily diclofenac (NSAID), take with food - Nightly methocarbamol (muscle relaxer), side effect can be drowsiness  After 2 weeks: - Both medications go to as-needed dosing (take if there is any signs of discomfort)  - Start exercises end of this week and continue until follow-up - Follow-up in 6 weeks - Contact for any questions

## 2023-04-02 NOTE — Assessment & Plan Note (Signed)
Chronic, ongoing for roughly 2+ years, atraumatic in onset.  Has been routinely seeing chiropractor for symptom control, no medications.  Denies any radiation into the extremities, no bowel/bladder involvement.  Pain from mid back to low back, most aggravated by prolonged walking, particularly uneven surfaces, prolonged sitting, twisting, etc.  Examination with negative straight leg raise bilaterally, FADIR and resisted hip flexion on right localized to right groin, otherwise FABER and piriformis testing bilaterally benign, tenderness to the paraspinal thoracolumbar regions, Kemps test positive localizing right lower thoracic.  X-rays were reviewed independently with the patient today.  Findings are most consistent with degenerative changes noted throughout the thoracolumbar spine, no red flag symptoms noted, reviewed various treatment strategies.  Plan: For the next 2 weeks: - Twice daily diclofenac (NSAID), take with food - Nightly methocarbamol (muscle relaxer), side effect can be drowsiness After 2 weeks: - Both medications go to as-needed dosing (take if there is any signs of discomfort) - Start exercises end of this week and continue until follow-up - Follow-up in 6 weeks - Contact for any questions

## 2023-05-03 ENCOUNTER — Other Ambulatory Visit: Payer: Self-pay | Admitting: Family Medicine

## 2023-05-03 NOTE — Telephone Encounter (Signed)
Requested medication (s) are due for refill today - no  Requested medication (s) are on the active medication list -yes  Future visit scheduled -yes  Last refill: 04/02/23 #60  Notes to clinic: non delegated Rx  Requested Prescriptions  Pending Prescriptions Disp Refills   methocarbamol (ROBAXIN) 500 MG tablet [Pharmacy Med Name: Methocarbamol 500mg  Tablet] 60 tablet 0    Sig: Take 1 tablet by mouth at bedtime as needed for muscle spasms.     Not Delegated - Analgesics:  Muscle Relaxants Failed - 05/03/2023 12:43 PM      Failed - This refill cannot be delegated      Passed - Valid encounter within last 6 months    Recent Outpatient Visits           1 month ago Spondylosis of thoracolumbar spine   [place] [facility] at [facility], Ocie Bob, MD   1 month ago Essential hypertension   [place] [facility] at [facility], [facility]   7 months ago Essential hypertension   [place] [facility] at [facility], [facility]   1 year ago Rhinosinusitis   [facility] & [facility] Medicine at [facility], Ocie Bob, MD   1 year ago Essential hypertension   [place] [facility] at [facility], [facility]       Future Appointments             In 1 week Duanne Limerick, MD [facility] Primary Care & Sports Medicine at [facility], [facility]   In 4 months Duanne Limerick, MD [facility] Primary Care & Sports Medicine at [facility], [facility]               Requested Prescriptions  Pending Prescriptions Disp Refills   methocarbamol (ROBAXIN) 500 MG tablet [Pharmacy Med Name: Methocarbamol 500mg  Tablet] 60 tablet 0    Sig: Take 1 tablet by mouth at bedtime as needed for muscle spasms.     Not Delegated - Analgesics:  Muscle Relaxants Failed - 05/03/2023 12:43 PM      Failed - This refill cannot be delegated       Passed - Valid encounter within last 6 months    Recent Outpatient Visits           1 month ago Spondylosis of thoracolumbar spine   [place] [facility] Medicine at [facility], Ocie Bob, MD   1 month ago Essential hypertension   [place] [facility] at [facility], [facility]   7 months ago Essential hypertension   [place] [facility] at [facility], [facility]   1 year ago Rhinosinusitis   [facility] Primary Care & Sports Medicine at [facility], Ocie Bob, MD   1 year ago Essential hypertension    Primary Care & Sports Medicine at [facility], [facility]       Future Appointments             In 1 week Duanne Limerick, MD [facility] Primary Care & Sports Medicine at [facility], [facility]   In 4 months Duanne Limerick, MD [facility] Primary Care & Sports Medicine at [facility], [facility]

## 2023-05-03 NOTE — Telephone Encounter (Signed)
Requested medication (s) are due for refill today: Yes  Requested medication (s) are on the active medication list: Yes  Last refill:  04/02/23  Future visit scheduled: Yes  Notes to clinic:  Manual review required     Requested Prescriptions  Pending Prescriptions Disp Refills   diclofenac (VOLTAREN) 75 MG EC tablet [Pharmacy Med Name: Diclofenac Sodium 75mg  Delayed-Release Tablet] 120 tablet 0    Sig: Take 1 tablet by mouth twice daily.     Analgesics:  NSAIDS Failed - 05/03/2023  2:58 PM      Failed - Manual Review: Labs are only required if the patient has taken medication for more than 8 weeks.      Failed - HGB in normal range and within 360 days    Hemoglobin  Date Value Ref Range Status  01/31/2022 13.4 13.0 - 17.0 g/dL Final  47/82/9562 13.0 12.6 - 17.7 g/dL Final         Failed - PLT in normal range and within 360 days    Platelets  Date Value Ref Range Status  01/31/2022 225 150 - 400 K/uL Final  11/01/2014 242 150 - 379 x10E3/uL Final         Failed - HCT in normal range and within 360 days    HCT  Date Value Ref Range Status  01/31/2022 37.0 (L) 39.0 - 52.0 % Final   Hematocrit  Date Value Ref Range Status  11/01/2014 41.1 37.5 - 51.0 % Final         Passed - Cr in normal range and within 360 days    Creatinine, Ser  Date Value Ref Range Status  03/28/2023 0.77 0.76 - 1.27 mg/dL Final         Passed - eGFR is 30 or above and within 360 days    GFR calc Af Amer  Date Value Ref Range Status  02/06/2019 119 >59 mL/min/1.73 Final   GFR calc non Af Amer  Date Value Ref Range Status  02/06/2019 103 >59 mL/min/1.73 Final   eGFR  Date Value Ref Range Status  03/28/2023 110 >59 mL/min/1.73 Final         Passed - Patient is not pregnant      Passed - Valid encounter within last 12 months    Recent Outpatient Visits           1 month ago Spondylosis of thoracolumbar spine   [place] [facility] at [facility]  Ashley Royalty, Ocie Bob, MD   1 month ago Essential hypertension   [place] [facility] at [facility], MD   7 months ago Essential hypertension   [place] [facility] at [facility], MD   1 year ago Rhinosinusitis   [facility] Primary Care & Sports Medicine at [facility], Ocie Bob, MD   1 year ago Essential hypertension   [place] [facility] at [facility], MD       Future Appointments             In 1 week Duanne Limerick, MD [facility] Primary Care & Sports Medicine at [facility], [facility]   In 4 months Duanne Limerick, MD [facility] Primary Care & Sports Medicine at [facility], [facility]

## 2023-05-16 ENCOUNTER — Ambulatory Visit: Payer: No Typology Code available for payment source | Admitting: Family Medicine

## 2023-06-05 ENCOUNTER — Ambulatory Visit: Payer: No Typology Code available for payment source | Admitting: Physician Assistant

## 2023-06-05 ENCOUNTER — Encounter: Payer: Self-pay | Admitting: Physician Assistant

## 2023-06-05 VITALS — BP 142/98 | HR 85 | Temp 98.2°F | Ht 67.0 in | Wt 180.0 lb

## 2023-06-05 DIAGNOSIS — J069 Acute upper respiratory infection, unspecified: Secondary | ICD-10-CM | POA: Diagnosis not present

## 2023-06-05 LAB — POCT INFLUENZA A/B
Influenza A, POC: NEGATIVE
Influenza B, POC: NEGATIVE

## 2023-06-05 LAB — POC COVID19 BINAXNOW: SARS Coronavirus 2 Ag: NEGATIVE

## 2023-06-05 MED ORDER — GUAIFENESIN-CODEINE 100-10 MG/5ML PO SOLN: 5.0000 mL | Freq: Three times a day (TID) | ORAL | 0 refills | Status: DC | PRN

## 2023-06-05 NOTE — Progress Notes (Signed)
 Date:  06/05/2023   Name:  Matthew Zuniga.   DOB:  [birth year]   MRN:  [id number]   Chief Complaint: Sinus Problem  Sinus Problem This is a new problem. The current episode started yesterday. The problem has been gradually worsening since onset. There has been no fever. His pain is at a severity of 7/10. The pain is moderate. Associated symptoms include congestion, coughing, headaches, sinus pressure, sneezing and a sore throat. (Chest congestion ) Past treatments include nothing.   Matthew Zuniga presents new to me today (typically sees my colleague Dr. Alayne Allis) here for evaluation of acute URI symptoms for the last 24 hours as above.  Requesting antibiotic, states Dr. Rochelle Chu usually gives him a Z-Pak.  Going out of town on Friday to continue working on his house in [place].   Medication list has been reviewed and updated.  Current Meds  Medication Sig   guaiFENesin -codeine  100-10 MG/5ML syrup Take 5 mLs by mouth 3 (three) times daily as needed for cough. May cause drowsiness   acyclovir  (ZOVIRAX ) 200 MG capsule Take 1 capsule by mouth daily.   diclofenac  (VOLTAREN ) 75 MG EC tablet Take 1 tablet (75 mg total) by mouth 2 (two) times daily.   esomeprazole  (NEXIUM ) 40 MG capsule Take 1 capsule (40 mg total) by mouth daily at 12 noon. otc   losartan  (COZAAR ) 100 MG tablet Take 1 tablet (100 mg total) by mouth daily.   methocarbamol  (ROBAXIN ) 500 MG tablet Take 1 tablet by mouth at bedtime as needed for muscle spasms.   metoprolol  succinate (TOPROL -XL) 25 MG 24 hr tablet Take 1 tablet by mouth once daily. Discontinue hydrochlorothiazide  and add metoprolol  to losartan .   milk thistle 175 MG tablet Take 175 mg by mouth daily.   montelukast  (SINGULAIR ) 10 MG tablet Take 1 tablet (10 mg total) by mouth at bedtime.   Multiple Vitamin (MULTIVITAMIN WITH MINERALS) TABS tablet Take 1 tablet by mouth daily.   Omega-3 1000 MG CAPS Take 1 capsule (1,000 mg total) by mouth daily.     Review of  Systems  HENT:  Positive for congestion, sinus pressure, sneezing and sore throat.   Respiratory:  Positive for cough.   Neurological:  Positive for headaches.    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   Alcohol intoxication (HCC) 04/22/2016   Pneumonia 04/22/2016   Hypertension 12/02/2015   GERD (gastroesophageal reflux disease) 12/02/2015   Viral syndrome 12/02/2015    No Known Allergies  Immunization History  Administered Date(s) Administered   Tdap 09/11/2016    Past Surgical History:  Procedure Laterality Date   COLONOSCOPY WITH PROPOFOL  N/A 08/17/2021   Procedure: COLONOSCOPY WITH PROPOFOL ;  Surgeon: Marnee Sink, MD;  [facility];  Service: Endoscopy;  Laterality: N/A;   POLYPECTOMY  08/17/2021   Procedure: POLYPECTOMY;  Surgeon: Marnee Sink, MD;  Location: [facility];  Service: Endoscopy;;    Social History   Tobacco Use   Smoking status: Never   Smokeless tobacco: Current    Types: Snuff  Substance Use Topics   Alcohol use: Yes    Alcohol/week: 6.0 standard drinks of alcohol    Types: 6 Cans of beer per week    Comment: daily   Drug use: Never  History reviewed. No pertinent family history.      03/28/2023    8:45 AM 09/25/2022    8:57 AM 03/27/2022    9:27 AM 01/31/2022    3:45 PM  GAD 7 : Generalized Anxiety Score  Nervous, Anxious, on Edge 0 0 0 0  Control/stop worrying 0 0 0 0  Worry too much - different things 0 0 0 0  Trouble relaxing 0 0 0 0  Restless 0 0 0 0  Easily annoyed or irritable 0 0 0 0  Afraid - awful might happen 0 0 0 0  Total GAD 7 Score 0 0 0 0  Anxiety Difficulty Not difficult at all Not difficult at all Not difficult at all Not difficult at all       03/28/2023    8:45 AM 09/25/2022    8:57 AM 03/27/2022     9:27 AM  Depression screen PHQ 2/9  Decreased Interest 0 0 0  Down, Depressed, Hopeless 0 0 0  PHQ - 2 Score 0 0 0  Altered sleeping 0 0 0  Tired, decreased energy 0 0 0  Change in appetite 0 0 0  Feeling bad or failure about yourself  0 0 0  Trouble concentrating 0 0 0  Moving slowly or fidgety/restless 0 0 0  Suicidal thoughts 0 0 0  PHQ-9 Score 0 0 0  Difficult doing work/chores Not difficult at all Not difficult at all Not difficult at all    BP Readings from Last 3 Encounters:  06/05/23 (!) 142/98  04/02/23 118/74  03/28/23 118/70    Wt Readings from Last 3 Encounters:  06/05/23 180 lb (81.6 kg)  04/02/23 171 lb (77.6 kg)  03/28/23 171 lb (77.6 kg)    BP (!) 142/98   Pulse 85   Temp 98.2 F (36.8 C)   Ht 5\' 7"  (1.702 m)   Wt 180 lb (81.6 kg)   SpO2 97%   BMI 28.19 kg/m   Physical Exam Vitals and nursing note reviewed.  Constitutional:      General: He is not in acute distress.    Appearance: Normal appearance.  HENT:     Right Ear: Tympanic membrane normal.     Left Ear: Tympanic membrane normal.     Ears:     Comments: EAC clear bilaterally with good view of TM which is without effusion or erythema.     Nose:     Right Sinus: Frontal sinus tenderness (pressure) present.     Left Sinus: No frontal sinus tenderness.     Mouth/Throat:     Mouth: Mucous membranes are moist.     Pharynx: Posterior oropharyngeal erythema (mild) present. No oropharyngeal exudate.  Eyes:     Conjunctiva/sclera: Conjunctivae normal.     Pupils: Pupils are equal, round, and reactive to light.  Cardiovascular:     Rate and Rhythm: Normal rate and regular rhythm.     Heart sounds: No murmur heard.    No friction rub. No gallop.  Pulmonary:     Effort: Pulmonary effort is normal.     Breath sounds: Normal breath sounds. No wheezing, rhonchi or rales.  Lymphadenopathy:     Cervical: No cervical adenopathy.     Recent Labs     Component Value Date/Time   NA 138  03/28/2023 0927   K 4.5 03/28/2023 0927   CL 99 03/28/2023 0927   CO2 26 03/28/2023 0927   GLUCOSE 100 (H) 03/28/2023 1610  BUN 11 03/28/2023 0927   CREATININE 0.77 03/28/2023 0927   CALCIUM 9.6 03/28/2023 0927   PROT 6.4 03/27/2022 1006   ALBUMIN 4.7 03/28/2023 0927   AST 33 03/27/2022 1006   ALT 33 03/27/2022 1006   ALKPHOS 68 03/27/2022 1006   BILITOT 0.4 03/27/2022 1006   GFRNONAA 103 02/06/2019 0943   GFRAA 119 02/06/2019 0943    Lab Results  Component Value Date   WBC 7.8 01/31/2022   HGB 13.4 01/31/2022   HCT 37.0 (L) 01/31/2022   MCV 94.1 01/31/2022   PLT 225 01/31/2022   No results found for: "HGBA1C" Lab Results  Component Value Date   CHOL 237 (H) 09/25/2022   HDL 101 09/25/2022   LDLCALC 128 (H) 09/25/2022   TRIG 51 09/25/2022   CHOLHDL 2.4 02/06/2019   No results found for: "TSH"   Assessment and Plan:  1. Acute URI (Primary) Likely viral etiology. COVID/ flu negative today. Discussed self-limited nature of viral illnesses and recommend conservative measures including rest, fluids, honey, and OTC cough/cold medications.   It is too early to consider prescribing antibiotics. Patient somewhat disappointed by this.   Sending Rx cough syrup as below.   Contact precautions advised to limit spread. Encouraged mask wearing and good hand hygiene especially before meals. Call if acutely worsening symptoms or if no improvement after Day 5 of illness  - POCT Influenza A/B - POC COVID-19 BinaxNow - guaiFENesin -codeine  100-10 MG/5ML syrup; Take 5 mLs by mouth 3 (three) times daily as needed for cough. May cause drowsiness  Dispense: 120 mL; Refill: 0   Return if symptoms worsen or fail to improve.    Cody Das, PA-C, DMSc, Nutritionist [facility] Primary Care and Sports Medicine [facility] [phone]

## 2023-06-12 ENCOUNTER — Other Ambulatory Visit: Payer: Self-pay | Admitting: Family Medicine

## 2023-06-12 ENCOUNTER — Other Ambulatory Visit: Payer: Self-pay | Admitting: Physician Assistant

## 2023-06-12 ENCOUNTER — Encounter: Payer: Self-pay | Admitting: Family Medicine

## 2023-06-12 DIAGNOSIS — J069 Acute upper respiratory infection, unspecified: Secondary | ICD-10-CM

## 2023-06-12 MED ORDER — AZITHROMYCIN 250 MG PO TABS: ORAL_TABLET | ORAL | 0 refills | Status: AC

## 2023-06-12 MED ORDER — GUAIFENESIN-CODEINE 100-10 MG/5ML PO SOLN: 5.0000 mL | Freq: Three times a day (TID) | ORAL | 0 refills | Status: AC | PRN

## 2023-07-19 ENCOUNTER — Other Ambulatory Visit: Payer: Self-pay | Admitting: Family Medicine

## 2023-07-22 NOTE — Telephone Encounter (Signed)
 Requested medication (s) are due for refill today - yes  Requested medication (s) are on the active medication list -yes  Future visit scheduled -yes  Last refill: 05/03/23 #60  Notes to clinic: non delegated Rx  Requested Prescriptions  Pending Prescriptions Disp Refills   methocarbamol (ROBAXIN) 500 MG tablet [Pharmacy Med Name: Methocarbamol 500mg  Tablet] 60 tablet 0    Sig: Take 1 tablet by mouth at bedtime as needed for muscle spasms.     Not Delegated - Analgesics:  Muscle Relaxants Failed - 07/22/2023 12:52 PM      Failed - This refill cannot be delegated      Passed - Valid encounter within last 6 months    Recent Outpatient Visits           1 month ago Acute URI   [place] [facility] at [facility] Mordecai Maes, Melton Alar, PA   3 months ago Spondylosis of thoracolumbar spine   [place] [facility] at [facility], Ocie Bob, MD   3 months ago Essential hypertension   [place] [facility], MD   10 months ago Essential hypertension   [place] [facility] at [facility], MD   1 year ago Rhinosinusitis   [facility] Primary Care & Sports Medicine at [facility], Ocie Bob, MD       Future Appointments             In 1 month Duanne Limerick, MD [facility] Primary Care & Sports Medicine at [facility], [facility]               Requested Prescriptions  Pending Prescriptions Disp Refills   methocarbamol (ROBAXIN) 500 MG tablet [Pharmacy Med Name: Methocarbamol 500mg  Tablet] 60 tablet 0    Sig: Take 1 tablet by mouth at bedtime as needed for muscle spasms.     Not Delegated - Analgesics:  Muscle Relaxants Failed - 07/22/2023 12:52 PM      Failed - This refill cannot be delegated      Passed - Valid encounter within last 6 months    Recent Outpatient Visits           1 month ago Acute URI   [facility] at [facility], Melton Alar, PA   3 months ago Spondylosis of thoracolumbar spine   [place] [facility] at [facility], Ocie Bob, MD   3 months ago Essential hypertension   [place] [facility] at [facility], MD   10 months ago Essential hypertension   [place] [facility] at [facility], MD   1 year ago Rhinosinusitis   [facility] Primary Care & Sports Medicine at [facility], Ocie Bob, MD       Future Appointments             In 1 month Duanne Limerick, MD [facility] Primary Care & Sports Medicine at [facility], [facility]

## 2023-07-26 ENCOUNTER — Other Ambulatory Visit: Payer: Self-pay | Admitting: Family Medicine

## 2023-07-26 DIAGNOSIS — B349 Viral infection, unspecified: Secondary | ICD-10-CM

## 2023-09-12 ENCOUNTER — Ambulatory Visit: Payer: Self-pay | Admitting: Family Medicine

## 2023-09-12 ENCOUNTER — Encounter: Payer: Self-pay | Admitting: Family Medicine

## 2023-09-12 VITALS — BP 128/82 | HR 91 | Resp 16 | Ht 67.0 in | Wt 173.2 lb

## 2023-09-12 DIAGNOSIS — K219 Gastro-esophageal reflux disease without esophagitis: Secondary | ICD-10-CM

## 2023-09-12 DIAGNOSIS — E785 Hyperlipidemia, unspecified: Secondary | ICD-10-CM | POA: Diagnosis not present

## 2023-09-12 DIAGNOSIS — M545 Low back pain, unspecified: Secondary | ICD-10-CM | POA: Diagnosis not present

## 2023-09-12 DIAGNOSIS — I10 Essential (primary) hypertension: Secondary | ICD-10-CM

## 2023-09-12 MED ORDER — METOPROLOL SUCCINATE ER 25 MG PO TB24: ORAL_TABLET | ORAL | 1 refills | Status: AC

## 2023-09-12 MED ORDER — DICLOFENAC SODIUM 75 MG PO TBEC: 75.0000 mg | DELAYED_RELEASE_TABLET | Freq: Two times a day (BID) | ORAL | 1 refills | Status: AC

## 2023-09-12 MED ORDER — LOSARTAN POTASSIUM 100 MG PO TABS
100.0000 mg | ORAL_TABLET | Freq: Every day | ORAL | 1 refills | Status: AC
Start: 2023-09-12 — End: ?

## 2023-09-12 NOTE — Patient Instructions (Signed)

## 2023-09-12 NOTE — Progress Notes (Signed)
 Date:  09/12/2023   Name:  Matthew Zuniga.   DOB:  April 28, 1971   MRN:  [id number]   Chief Complaint: Hypertension and Gastroesophageal Reflux  Hypertension This is a chronic problem. The current episode started more than 1 year ago. The problem has been gradually improving since onset. The problem is controlled. Pertinent negatives include no anxiety, blurred vision, chest pain, headaches, malaise/fatigue, neck pain, orthopnea, palpitations, peripheral edema, PND, shortness of breath or sweats. There are no associated agents to hypertension. There are no known risk factors for coronary artery disease. Past treatments include angiotensin blockers and beta blockers. The current treatment provides moderate improvement. There are no compliance problems.  There is no history of angina or CAD/MI.  Gastroesophageal Reflux He reports no abdominal pain, no chest pain, no coughing, no nausea, no sore throat or no wheezing. The current episode started more than 1 year ago. The problem occurs rarely. The problem has been gradually improving. The symptoms are aggravated by certain foods. He has tried a PPI for the symptoms. The treatment provided moderate relief.  Back Pain This is a chronic problem. The current episode started more than 1 year ago. The problem occurs intermittently. The problem has been gradually improving since onset. The pain is present in the lumbar spine. The quality of the pain is described as aching. The pain does not radiate. The symptoms are aggravated by bending and twisting. Pertinent negatives include no abdominal pain, bladder incontinence, bowel incontinence, chest pain, dysuria, fever or headaches. He has tried NSAIDs for the symptoms. The treatment provided moderate relief.    Lab Results  Component Value Date   NA 138 03/28/2023   K 4.5 03/28/2023   CO2 26 03/28/2023   GLUCOSE 100 (H) 03/28/2023   BUN 11 03/28/2023   CREATININE 0.77 03/28/2023   CALCIUM 9.6  03/28/2023   EGFR 110 03/28/2023   GFRNONAA 103 02/06/2019   Lab Results  Component Value Date   CHOL 237 (H) 09/25/2022   HDL 101 09/25/2022   LDLCALC 128 (H) 09/25/2022   TRIG 51 09/25/2022   CHOLHDL 2.4 02/06/2019   No results found for: "TSH" No results found for: "HGBA1C" Lab Results  Component Value Date   WBC 7.8 01/31/2022   HGB 13.4 01/31/2022   HCT 37.0 (L) 01/31/2022   MCV 94.1 01/31/2022   PLT 225 01/31/2022   Lab Results  Component Value Date   ALT 33 03/27/2022   AST 33 03/27/2022   ALKPHOS 68 03/27/2022   BILITOT 0.4 03/27/2022   No results found for: "25OHVITD2", "25OHVITD3", "VD25OH"   Review of Systems  Constitutional:  Negative for chills, fever and malaise/fatigue.  HENT:  Negative for drooling, ear discharge, ear pain and sore throat.   Eyes:  Negative for blurred vision.  Respiratory:  Negative for cough, shortness of breath and wheezing.   Cardiovascular:  Negative for chest pain, palpitations, orthopnea, leg swelling and PND.  Gastrointestinal:  Negative for abdominal pain, blood in stool, bowel incontinence, constipation, diarrhea and nausea.  Endocrine: Negative for polydipsia.  Genitourinary:  Negative for bladder incontinence, dysuria, frequency, hematuria and urgency.  Musculoskeletal:  Positive for arthralgias, back pain and myalgias. Negative for neck pain.  Skin:  Negative for rash.  Allergic/Immunologic: Negative for environmental allergies.  Neurological:  Negative for dizziness and headaches.  Hematological:  Does not bruise/bleed easily.  Psychiatric/Behavioral:  Negative for suicidal ideas. The patient is not nervous/anxious.     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   Alcohol intoxication (HCC) 04/22/2016   Pneumonia 04/22/2016    Hypertension 12/02/2015   GERD (gastroesophageal reflux disease) 12/02/2015   Viral syndrome 12/02/2015    No Known Allergies  Past Surgical History:  Procedure Laterality Date   COLONOSCOPY WITH PROPOFOL  N/A 08/17/2021   Procedure: COLONOSCOPY WITH PROPOFOL ;  Surgeon: Marnee Sink, MD;  Location: [facility];  Service: Endoscopy;  Laterality: N/A;   POLYPECTOMY  08/17/2021   Procedure: POLYPECTOMY;  Surgeon: Marnee Sink, MD;  Location: [facility];  Service: Endoscopy;;    Social History   Tobacco Use   Smoking status: Never   Smokeless tobacco: Current    Types: Snuff  Substance Use Topics   Alcohol use: Yes    Alcohol/week: 6.0 standard drinks of alcohol    Types: 6 Cans of beer per week    Comment: daily   Drug use: Never     Medication list has been reviewed and updated.  Current Meds  Medication Sig   acyclovir  (ZOVIRAX ) 200 MG capsule Take 1 capsule by mouth daily.   diclofenac  (VOLTAREN ) 75 MG EC tablet Take 1 tablet (75 mg total) by mouth 2 (two) times daily.   esomeprazole  (NEXIUM ) 40 MG capsule Take 1 capsule (40 mg total) by mouth daily at 12 noon. otc   losartan  (COZAAR ) 100 MG tablet Take 1 tablet (100 mg total) by mouth daily.   methocarbamol  (ROBAXIN ) 500 MG tablet Take 1 tablet by mouth at bedtime as needed for muscle spasms.   metoprolol  succinate (TOPROL -XL) 25 MG 24 hr tablet Take 1 tablet by mouth once daily. Discontinue hydrochlorothiazide  and add metoprolol  to losartan .   milk thistle 175 MG tablet Take 175 mg by mouth daily.   Multiple Vitamin (MULTIVITAMIN WITH MINERALS) TABS tablet Take 1 tablet by mouth daily.   Omega-3 1000 MG CAPS Take 1 capsule (1,000 mg total) by mouth daily.       09/12/2023    8:41 AM 03/28/2023    8:45 AM 09/25/2022    8:57 AM 03/27/2022    9:27 AM  GAD 7 : Generalized Anxiety Score  Nervous, Anxious, on Edge 0 0 0 0  Control/stop worrying 0 0 0 0  Worry too much - different things 0 0 0 0  Trouble  relaxing 0 0 0 0  Restless 0 0 0 0  Easily annoyed or irritable 0 0 0 0  Afraid - awful might happen 0 0 0 0  Total GAD 7 Score 0 0 0 0  Anxiety Difficulty Not difficult at all Not difficult at all Not difficult at all Not difficult at all       09/12/2023    8:41 AM 03/28/2023    8:45 AM 09/25/2022    8:57 AM  Depression screen PHQ 2/9  Decreased Interest 0 0 0  Down, Depressed, Hopeless 0 0 0  PHQ - 2 Score 0 0 0  Altered sleeping 0 0 0  Tired, decreased energy 0 0 0  Change in appetite 0 0 0  Feeling bad or failure about yourself  0 0 0  Trouble concentrating 0 0 0  Moving slowly or fidgety/restless 0 0 0  Suicidal thoughts 0 0 0  PHQ-9 Score 0 0 0  Difficult doing work/chores  Not difficult at all Not difficult at all Not difficult at all    BP Readings from Last 3 Encounters:  09/12/23 128/82  06/05/23 (!) 142/98  04/02/23 118/74    Physical Exam Vitals and nursing note reviewed.  Constitutional:      Appearance: He is well-developed.  HENT:     Head: Normocephalic and atraumatic.     Right Ear: Tympanic membrane, ear canal and external ear normal.     Left Ear: Tympanic membrane, ear canal and external ear normal.     Nose: Nose normal. No congestion.     Mouth/Throat:     Mouth: Mucous membranes are moist.     Dentition: Normal dentition.  Eyes:     General: Lids are normal. No scleral icterus.    Conjunctiva/sclera: Conjunctivae normal.     Pupils: Pupils are equal, round, and reactive to light.  Neck:     Thyroid: No thyromegaly.     Vascular: No carotid bruit, hepatojugular reflux or JVD.     Trachea: No tracheal deviation.  Cardiovascular:     Rate and Rhythm: Normal rate and regular rhythm.     Heart sounds: Normal heart sounds. No murmur heard.    No friction rub. No gallop.  Pulmonary:     Effort: Pulmonary effort is normal.     Breath sounds: Normal breath sounds. No wheezing, rhonchi or rales.  Abdominal:     General: Bowel sounds are normal.      Palpations: Abdomen is soft. There is no hepatomegaly, splenomegaly or mass.     Tenderness: There is no abdominal tenderness.     Hernia: There is no hernia in the left inguinal area.  Musculoskeletal:        General: Normal range of motion.     Cervical back: Normal range of motion and neck supple.  Lymphadenopathy:     Cervical: No cervical adenopathy.  Skin:    General: Skin is warm and dry.     Findings: No rash.  Neurological:     Mental Status: He is alert and oriented to person, place, and time.     Sensory: No sensory deficit.     Deep Tendon Reflexes: Reflexes are normal and symmetric.  Psychiatric:        Mood and Affect: Mood is not anxious or depressed.     Wt Readings from Last 3 Encounters:  09/12/23 173 lb 3.2 oz (78.6 kg)  06/05/23 180 lb (81.6 kg)  04/02/23 171 lb (77.6 kg)    BP 128/82   Pulse 91   Resp 16   Ht 5\' 7"  (1.702 m)   Wt 173 lb 3.2 oz (78.6 kg)   SpO2 97%   BMI 27.13 kg/m   Assessment and Plan: 1. Essential hypertension (Primary) Chronic.  Controlled.  Stable.  Asymptomatic.  Tolerating medications well.  Blood pressure today is 128/82.  Will continue losartan  100 mg once a day and metoprolol  XL 25 mg once a day.  Will check renal function panel for electrolytes and GFR. - losartan  (COZAAR ) 100 MG tablet; Take 1 tablet (100 mg total) by mouth daily.  Dispense: 90 tablet; Refill: 1 - metoprolol  succinate (TOPROL -XL) 25 MG 24 hr tablet; Take 1 tablet by mouth once daily. Discontinue hydrochlorothiazide  and add metoprolol  to losartan .  Dispense: 90 tablet; Refill: 1 - Lipid Panel With LDL/HDL Ratio  2. Lumbar pain Chronic.  Followed by Dr. Augustus Ledger.  Patient is doing extremely well on diclofenac  and would like  to continue this on a regular basis.  We will monitor GFR with renal function panel and will continue diclofenac  75 mg 1 twice a day. - diclofenac  (VOLTAREN ) 75 MG EC tablet; Take 1 tablet (75 mg total) by mouth 2 (two) times daily.   Dispense: 180 tablet; Refill: 1  3. Gastroesophageal reflux disease without esophagitis Chronic.  Controlled.  Stable.  Patient is controlling this with over-the-counter Nexium  and will continue with this.  Patient is having no dysphagia or breakthrough heartburn.  4. Hyperlipidemia, unspecified hyperlipidemia type Chronic.  Controlled.  Stable.  Patient is diet controlled we have reemphasized and provided low-cholesterol low triglyceride dietary guidelines.  Will check lipid panel for current level of the LDL status. - Renal Function Panel  -Lipid panel   Alayne Allis, MD

## 2023-09-13 ENCOUNTER — Encounter: Payer: Self-pay | Admitting: Family Medicine

## 2023-09-13 LAB — RENAL FUNCTION PANEL
Albumin: 4.5 g/dL (ref 4.1–5.1)
BUN/Creatinine Ratio: 19 (ref 9–20)
BUN: 16 mg/dL (ref 6–24)
CO2: 25 mmol/L (ref 20–29)
Calcium: 9.9 mg/dL (ref 8.7–10.2)
Chloride: 103 mmol/L (ref 96–106)
Creatinine, Ser: 0.86 mg/dL (ref 0.76–1.27)
Glucose: 72 mg/dL (ref 70–99)
Phosphorus: 3.2 mg/dL (ref 2.8–4.1)
Potassium: 4.9 mmol/L (ref 3.5–5.2)
Sodium: 141 mmol/L (ref 134–144)
eGFR: 105 mL/min/{1.73_m2} (ref >59)

## 2023-09-13 LAB — LIPID PANEL WITH LDL/HDL RATIO
Cholesterol, Total: 212 mg/dL — ABNORMAL HIGH (ref 100–199)
HDL: 82 mg/dL (ref >39)
LDL Chol Calc (NIH): 117 mg/dL — ABNORMAL HIGH (ref 0–99)
LDL/HDL Ratio: 1.4 ratio (ref 0.0–3.6)
Triglycerides: 71 mg/dL (ref 0–149)
VLDL Cholesterol Cal: 13 mg/dL (ref 5–40)
